# Patient Record
Sex: Female | Born: 1950
Health system: Southern US, Community
[De-identification: ages and names within clinical notes are randomized; demographics above are authoritative.]

---

## 2004-12-11 ENCOUNTER — Emergency Department: Payer: Self-pay | Admitting: Emergency Medicine

## 2004-12-12 ENCOUNTER — Emergency Department: Payer: Self-pay | Admitting: Emergency Medicine

## 2004-12-15 ENCOUNTER — Ambulatory Visit: Payer: Self-pay | Admitting: Physician Assistant

## 2005-01-04 ENCOUNTER — Ambulatory Visit: Payer: Self-pay | Admitting: Unknown Physician Specialty

## 2005-01-09 ENCOUNTER — Encounter: Payer: Self-pay | Admitting: Unknown Physician Specialty

## 2005-01-25 ENCOUNTER — Encounter: Payer: Self-pay | Admitting: Unknown Physician Specialty

## 2005-02-22 ENCOUNTER — Encounter: Payer: Self-pay | Admitting: Unknown Physician Specialty

## 2005-03-25 ENCOUNTER — Encounter: Payer: Self-pay | Admitting: Unknown Physician Specialty

## 2007-06-14 ENCOUNTER — Ambulatory Visit: Payer: Self-pay | Admitting: Specialist

## 2007-08-22 ENCOUNTER — Ambulatory Visit: Payer: Self-pay | Admitting: Gastroenterology

## 2007-08-23 ENCOUNTER — Ambulatory Visit: Payer: Self-pay | Admitting: Internal Medicine

## 2008-03-13 ENCOUNTER — Ambulatory Visit: Payer: Self-pay | Admitting: Specialist

## 2008-12-09 ENCOUNTER — Ambulatory Visit: Payer: Self-pay | Admitting: Unknown Physician Specialty

## 2017-10-10 ENCOUNTER — Ambulatory Visit (INDEPENDENT_AMBULATORY_CARE_PROVIDER_SITE_OTHER): Payer: PPO | Admitting: Primary Care

## 2017-10-10 ENCOUNTER — Encounter: Payer: Self-pay | Admitting: Primary Care

## 2017-10-10 ENCOUNTER — Other Ambulatory Visit (HOSPITAL_COMMUNITY)
Admission: RE | Admit: 2017-10-10 | Discharge: 2017-10-10 | Disposition: A | Discharge status: Home / Self Care | Payer: PPO | Source: Ambulatory Visit | Attending: Primary Care | Admitting: Primary Care

## 2017-10-10 VITALS — BP 118/76 | HR 74 | Temp 98.1°F | Ht 62.75 in | Wt 106.8 lb

## 2017-10-10 DIAGNOSIS — Z0000 Encounter for general adult medical examination without abnormal findings: Secondary | ICD-10-CM | POA: Insufficient documentation

## 2017-10-10 DIAGNOSIS — Z23 Encounter for immunization: Secondary | ICD-10-CM

## 2017-10-10 DIAGNOSIS — Z01419 Encounter for gynecological examination (general) (routine) without abnormal findings: Secondary | ICD-10-CM | POA: Diagnosis not present

## 2017-10-10 DIAGNOSIS — Z124 Encounter for screening for malignant neoplasm of cervix: Secondary | ICD-10-CM | POA: Insufficient documentation

## 2017-10-10 DIAGNOSIS — Z0001 Encounter for general adult medical examination with abnormal findings: Secondary | ICD-10-CM | POA: Insufficient documentation

## 2017-10-10 NOTE — Progress Notes (Signed)
Subjective:    Patient ID: [name], female    DOB: 09-Jun-1951, 66 y.o.   MRN: [id number]  HPI  [name] is a 66 year old female who presents today to establish care and for complete physical. She has not seen a medical provider in years.   Immunizations: -Tetanus: Unsure, believes it's been over 10 years. -Influenza: Due today. -Pneumonia: Never completed.    Diet: She endorses a fair diet. Breakfast: Muffin, cinnamon toast Lunch: Sandwich, hot pockets Dinner: Meat, vegetable, starch Snacks: None Desserts: None Beverages: Coffee, sweet tea, little water  Exercise: She does not currently exercise. Does walk occasionally in the morning. Eye exam: Completed 2-3 years ago. Dental exam: Completed 1 year ago. Colonoscopy: Completed several, thinks this was 8-9 years ago. Dexa: Completed over three years ago. Declines Pap Smear: Completed over 5 years ago. Due today. Mammogram: Has not completed in several years. Declines.    Review of Systems  Constitutional: Negative for unexpected weight change.  HENT: Negative for rhinorrhea.   Respiratory: Negative for cough and shortness of breath.   Cardiovascular: Negative for chest pain.  Gastrointestinal: Negative for constipation and diarrhea.  Genitourinary: Negative for difficulty urinating and menstrual problem.  Musculoskeletal: Negative for arthralgias and myalgias.  Skin: Negative for rash.  Allergic/Immunologic: Negative for environmental allergies.  Neurological: Negative for dizziness, numbness and headaches.  Psychiatric/Behavioral:       Denies concerns for anxiety and depression.       No past medical history on file.   Social History   Social History  . Marital status: Married    Spouse name: N/A  . Number of children: N/A  . Years of education: N/A   Occupational History  . Not on file.   Social History Main Topics  . Smoking status: Former Research scientist (life sciences)  . Smokeless tobacco: Never Used  . Alcohol  use No  . Drug use: Unknown  . Sexual activity: Not on file   Other Topics Concern  . Not on file   Social History Narrative   Married.   2 children, 6 grand children.   Retired. 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     No past surgical history on file.  No family history on file.  No Known Allergies  No current outpatient prescriptions on file prior to visit.   No current facility-administered medications on file prior to visit.     BP 118/76   Pulse 74   Temp 98.1 F (36.7 C) (Oral)   Ht 5' 2.75" (1.594 m)   Wt 106 lb 12.8 oz (48.4 kg)   SpO2 98%   BMI 19.07 kg/m    Objective:   Physical Exam  Constitutional: She is oriented to person, place, and time. She appears well-nourished.  HENT:  Right Ear: Tympanic membrane and ear canal normal.  Left Ear: Tympanic membrane and ear canal normal.  Nose: Nose normal.  Mouth/Throat: Oropharynx is clear and moist.  Eyes: Pupils are equal, round, and reactive to light. Conjunctivae and EOM are normal.  Neck: Neck supple. No thyromegaly present.  Cardiovascular: Normal rate and regular rhythm.   No murmur heard. Pulmonary/Chest: Effort normal and breath sounds normal. She has no rales.  Abdominal: Soft. Bowel sounds are normal. There is no tenderness.  Genitourinary: There is no tenderness or lesion on the right labia. There is no tenderness or lesion on the left labia. Cervix exhibits no motion tenderness and no discharge. Right  adnexum displays no tenderness. Left adnexum displays no tenderness. No erythema in the vagina. No vaginal discharge found.  Musculoskeletal: Normal range of motion.  Lymphadenopathy:    She has no cervical adenopathy.  Neurological: She is alert and oriented to person, place, and time. She has normal reflexes. No cranial nerve deficit.  Skin: Skin is warm and dry. No rash noted.  Psychiatric: She has a normal mood and affect.          Assessment & Plan:

## 2017-10-10 NOTE — Addendum Note (Signed)
Addended by: Jacqualin Combes on: 10/10/2017 04:38 PM   Modules accepted: Orders

## 2017-10-10 NOTE — Patient Instructions (Addendum)
Please notify me if you change your mind regarding the mammogram and bone density testing.   Complete lab work prior to leaving today. I will notify you of your results once received.   We will notify you of your pap smear results once received.   Ensure you are consuming 64 ounces of water daily.  Increase consumption of vegetables, fruit, whole grains.  You were provided with an influenza vaccination and pneumonia vaccination today.  Follow up in 1 year for your annual exam or sooner if needed.  It was a pleasure to meet you today! Please don't hesitate to call me with any questions. Welcome to [facility]!

## 2017-10-10 NOTE — Assessment & Plan Note (Signed)
Will check on insurance coverage regarding tetanus. Prevnar due today, provided. Influenza vaccination provided today. Declines mammogram and bone density testing. Pap smear due, completed today. Colonoscopy UTD per patient. Recommended to eat regular meals, start drinking water. Exam unremarkable. Labs pending. Follow up in 1 year.

## 2017-10-11 LAB — COMPREHENSIVE METABOLIC PANEL
ALBUMIN: 4.4 g/dL (ref 3.5–5.2)
ALT: 11 U/L (ref 0–35)
AST: 19 U/L (ref 0–37)
Alkaline Phosphatase: 77 U/L (ref 39–117)
BUN: 7 mg/dL (ref 6–23)
CHLORIDE: 103 meq/L (ref 96–112)
CO2: 31 mEq/L (ref 19–32)
Calcium: 10.1 mg/dL (ref 8.4–10.5)
Creatinine, Ser: 0.62 mg/dL (ref 0.40–1.20)
GFR: 102.48 mL/min (ref >60.00)
Glucose, Bld: 89 mg/dL (ref 70–99)
POTASSIUM: 3.9 meq/L (ref 3.5–5.1)
SODIUM: 139 meq/L (ref 135–145)
Total Bilirubin: 0.4 mg/dL (ref 0.2–1.2)
Total Protein: 7.4 g/dL (ref 6.0–8.3)

## 2017-10-11 LAB — LIPID PANEL
CHOLESTEROL: 183 mg/dL (ref 0–200)
HDL: 49.1 mg/dL (ref >39.00)
LDL CALC: 106 mg/dL — AB (ref 0–99)
NonHDL: 134.17
Total CHOL/HDL Ratio: 4
Triglycerides: 139 mg/dL (ref 0.0–149.0)
VLDL: 27.8 mg/dL (ref 0.0–40.0)

## 2017-10-15 LAB — CYTOLOGY - PAP
DIAGNOSIS: NEGATIVE
HPV (WINDOPATH): NOT DETECTED

## 2017-10-16 ENCOUNTER — Encounter: Payer: Self-pay | Admitting: *Deleted

## 2018-10-08 ENCOUNTER — Other Ambulatory Visit: Payer: Self-pay | Admitting: Primary Care

## 2018-10-08 DIAGNOSIS — Z0000 Encounter for general adult medical examination without abnormal findings: Secondary | ICD-10-CM

## 2018-10-11 ENCOUNTER — Ambulatory Visit: Payer: PPO

## 2018-11-27 ENCOUNTER — Telehealth: Payer: Self-pay

## 2018-11-27 NOTE — Telephone Encounter (Signed)
[facility] faxed note pt returning call. Do not see note in pts chart.Please advise.

## 2018-11-27 NOTE — Telephone Encounter (Signed)
I have spoken to patient and she stated that she had reschedule the appointment to 12/02/2018. She just wanted to make sure that is still correct.

## 2018-11-29 ENCOUNTER — Ambulatory Visit: Payer: PPO

## 2018-12-02 ENCOUNTER — Ambulatory Visit (INDEPENDENT_AMBULATORY_CARE_PROVIDER_SITE_OTHER): Payer: Medicare HMO

## 2018-12-02 VITALS — BP 100/60 | HR 65 | Temp 98.5°F | Ht 63.5 in | Wt 115.8 lb

## 2018-12-02 DIAGNOSIS — Z23 Encounter for immunization: Secondary | ICD-10-CM

## 2018-12-02 DIAGNOSIS — Z0000 Encounter for general adult medical examination without abnormal findings: Secondary | ICD-10-CM | POA: Diagnosis not present

## 2018-12-02 DIAGNOSIS — Z1159 Encounter for screening for other viral diseases: Secondary | ICD-10-CM

## 2018-12-02 LAB — COMPREHENSIVE METABOLIC PANEL
ALT: 10 U/L (ref 0–35)
AST: 20 U/L (ref 0–37)
Albumin: 4.5 g/dL (ref 3.5–5.2)
Alkaline Phosphatase: 83 U/L (ref 39–117)
BILIRUBIN TOTAL: 0.7 mg/dL (ref 0.2–1.2)
BUN: 8 mg/dL (ref 6–23)
CALCIUM: 9.7 mg/dL (ref 8.4–10.5)
CO2: 28 meq/L (ref 19–32)
Chloride: 102 mEq/L (ref 96–112)
Creatinine, Ser: 0.67 mg/dL (ref 0.40–1.20)
GFR: 93.38 mL/min (ref >60.00)
GLUCOSE: 91 mg/dL (ref 70–99)
POTASSIUM: 4 meq/L (ref 3.5–5.1)
Sodium: 138 mEq/L (ref 135–145)
Total Protein: 7.3 g/dL (ref 6.0–8.3)

## 2018-12-02 LAB — LIPID PANEL
Cholesterol: 186 mg/dL (ref 0–200)
HDL: 57.6 mg/dL (ref >39.00)
LDL CALC: 116 mg/dL — AB (ref 0–99)
NonHDL: 128.85
TRIGLYCERIDES: 65 mg/dL (ref 0.0–149.0)
Total CHOL/HDL Ratio: 3
VLDL: 13 mg/dL (ref 0.0–40.0)

## 2018-12-02 NOTE — Progress Notes (Signed)
I reviewed health advisor's note, was available for consultation, and agree with documentation and plan.  

## 2018-12-02 NOTE — Progress Notes (Signed)
Subjective:   BLANKA ROCKHOLT is a 67 y.o. female who presents for an Initial Medicare Annual Wellness Visit.  Review of Systems    N/A  Cardiac Risk Factors include: advanced age (>28men, >7 women)     Objective:    Today's Vitals   12/02/18 1217  BP: 100/60  Pulse: 65  Temp: 98.5 F (36.9 C)  TempSrc: Oral  SpO2: 95%  Weight: 115 lb 12 oz (52.5 kg)  Height: 5' 3.5" (1.613 m)  PainSc: 0-No pain   Body mass index is 20.18 kg/m.  Advanced Directives 12/02/2018  Does Patient Have a Medical Advance Directive? No  Would patient like information on creating a medical advance directive? Yes (MAU/Ambulatory/Procedural Areas - Information given)    Current Medications (verified) No outpatient encounter medications on file as of 12/02/2018.   No facility-administered encounter medications on file as of 12/02/2018.     Allergies (verified) Patient has no known allergies.   History: History reviewed. No pertinent past medical history. History reviewed. No pertinent surgical history. History reviewed. No pertinent family history. Social History   Socioeconomic History  . Marital status: Married    Spouse name: Not on file  . Number of children: Not on file  . Years of education: Not on file  . Highest education level: Not on file  Occupational History  . Not on file  Social Needs  . Financial resource strain: Not on file  . Food insecurity:    Worry: Not on file    Inability: Not on file  . Transportation needs:    Medical: Not on file    Non-medical: Not on file  Tobacco Use  . Smoking status: Former Research scientist (life sciences)  . Smokeless tobacco: Never Used  Substance and Sexual Activity  . Alcohol use: No  . Drug use: Not Currently  . Sexual activity: Not on file  Lifestyle  . Physical activity:    Days per week: Not on file    Minutes per session: Not on file  . Stress: Not on file  Relationships  . Social connections:    Talks on phone: Not on file    Gets  together: Not on file    Attends religious service: Not on file    Active member of club or organization: Not on file    Attends meetings of clubs or organizations: Not on file    Relationship status: Not on file  Other Topics Concern  . Not on file  Social History Narrative   Married.   2 children, 6 grand children.   Retired. 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     Tobacco Counseling Counseling given: No   Clinical Intake:  Pre-visit preparation completed: Yes  Pain : No/denies pain Pain Score: 0-No pain     Nutritional Status: BMI of 19-24  Normal Nutritional Risks: None Diabetes: No  How often do you need to have someone help you when you read instructions, pamphlets, or other written materials from your doctor or pharmacy?: 1 - Never What is the last grade level you completed in school?: 12th grade + 1 yr college  Interpreter Needed?: No  Comments: pt lives with spouse Information entered by :: LPinson, LPN   Activities of Daily Living In your present state of health, do you have any difficulty performing the following activities: 12/02/2018  Hearing? N  Vision? Y  Difficulty concentrating or making decisions? N  Walking or climbing stairs? N  Dressing or bathing? N  Doing errands, shopping? N  Preparing Food and eating ? N  Using the Toilet? N  In the past six months, have you accidently leaked urine? N  Do you have problems with loss of bowel control? N  Managing your Medications? N  Managing your Finances? N  Housekeeping or managing your Housekeeping? N  Some recent data might be hidden     Immunizations and Health Maintenance Immunization History  Administered Date(s) Administered  . Influenza,inj,Quad PF,6+ Mos 10/10/2017, 12/02/2018  . Pneumococcal Conjugate-13 10/10/2017  . Pneumococcal Polysaccharide-23 12/02/2018   There are no preventive care reminders to display for this patient.  Patient Care Team: Pleas Koch, NP as PCP - General (Internal Medicine)  Indicate any recent Medical Services you may have received from other than [facility] providers in the past year (date may be approximate).     Assessment:   This is a routine wellness examination for [place].  Hearing/Vision screen  Hearing Screening   125Hz  250Hz  500Hz  1000Hz  2000Hz  3000Hz  4000Hz  6000Hz  8000Hz   Right ear:   40 40 40  40    Left ear:   40 40 40  40      Visual Acuity Screening   Right eye Left eye Both eyes  Without correction:     With correction: 20/50 20/40-1 20/50    Dietary issues and exercise activities discussed: Current Exercise Habits: The patient does not participate in regular exercise at present, Exercise limited by: None identified  Goals    . Patient Stated     Starting 12/02/2018,  I will continue to keep all scheduled appointments with my PCP.      Depression Screen PHQ 2/9 Scores 12/02/2018 10/10/2017  PHQ - 2 Score 0 0  PHQ- 9 Score 0 -    Fall Risk Fall Risk  12/02/2018 10/10/2017  Falls in the past year? 0 No   Cognitive Function: MMSE - Mini Mental State Exam 12/02/2018  Orientation to time 5  Orientation to Place 5  Registration 3  Attention/ Calculation 0  Recall 3  Language- name 2 objects 0  Language- repeat 1  Language- follow 3 step command 3  Language- read & follow direction 0  Write a sentence 0  Copy design 0  Total score 20     PLEASE NOTE: A Mini-Cog screen was completed. Maximum score is 20. A value of 0 denotes this part of Folstein MMSE was not completed or the patient failed this part of the Mini-Cog screening.   Mini-Cog Screening Orientation to Time - Max 5 pts Orientation to Place - Max 5 pts Registration - Max 3 pts Recall - Max 3 pts Language Repeat - Max 1 pts Language Follow 3 Step Command - Max 3 pts     Screening Tests Health Maintenance  Topic Date Due  . MAMMOGRAM  12/25/2019 (Originally 03/02/2002)  . DEXA SCAN  12/25/2019 (Originally  03/02/2017)  . COLONOSCOPY  12/25/2019 (Originally 03/02/2002)  . TETANUS/TDAP  12/25/2019 (Originally 03/03/1971)  . INFLUENZA VACCINE  Completed  . Hepatitis C Screening  Completed  . PNA vac Low Risk Adult  Completed     Plan:   I have personally reviewed, addressed, and noted the following in the patient's chart:  A. Medical and social history B. Use of alcohol, tobacco or illicit drugs  C. Current medications and supplements D. Functional ability and status E.  Nutritional status F.  Physical activity G. Advance directives H. List of  other physicians I.  Hospitalizations, surgeries, and ER visits in previous 12 months J.  [place] to include hearing, vision, cognitive, depression L. Referrals and appointments - none  In addition, I have reviewed and discussed with patient certain preventive protocols, quality metrics, and best practice recommendations. A written personalized care plan for preventive services as well as general preventive health recommendations were provided to patient.  See attached scanned questionnaire for additional information.   Signed,   Lindell Noe, MHA, BS, LPN Health Coach

## 2018-12-02 NOTE — Progress Notes (Signed)
PCP notes:   Health maintenance:  PPSV23 - administered Flu vaccine - administered Mammogram - PCP follow-up requested Bone density - PCP follow-up requested Colon cancer screening - PCP follow-up requested Tetanus vaccine - postponed/insurance Hep C screening - completed  Abnormal screenings:   None  Patient concerns:   None  Nurse concerns:  None  Next PCP appt:   12/05/18 @ 1100

## 2018-12-02 NOTE — Patient Instructions (Signed)
Ms. Mikaelian , Thank you for taking time to come for your Medicare Wellness Visit. I appreciate your ongoing commitment to your health goals. Please review the following plan we discussed and let me know if I can assist you in the future.   These are the goals we discussed: Goals    . Patient Stated     Starting 12/02/2018,  I will continue to keep all scheduled appointments with my PCP.       This is a list of the screening recommended for you and due dates:  Health Maintenance  Topic Date Due  . Mammogram  12/25/2019*  . DEXA scan (bone density measurement)  12/25/2019*  . Colon Cancer Screening  12/25/2019*  . Tetanus Vaccine  12/25/2019*  . Flu Shot  Completed  .  Hepatitis C: One time screening is recommended by Center for Disease Control  (CDC) for  adults born from [birth year] through 1964.   Completed  . Pneumonia vaccines  Completed  *Topic was postponed. The date shown is not the original due date.   Preventive Care for Adults  A healthy lifestyle and preventive care can promote health and wellness. Preventive health guidelines for adults include the following key practices.  . A routine yearly physical is a good way to check with your health care provider about your health and preventive screening. It is a chance to share any concerns and updates on your health and to receive a thorough exam.  . Visit your dentist for a routine exam and preventive care every 6 months. Brush your teeth twice a day and floss once a day. Good oral hygiene prevents tooth decay and gum disease.  . The frequency of eye exams is based on your age, health, family medical history, use  of contact lenses, and other factors. Follow your health care provider's recommendations for frequency of eye exams.  . Eat a healthy diet. Foods like vegetables, fruits, whole grains, low-fat dairy products, and lean protein foods contain the nutrients you need without too many calories. Decrease your intake of foods high in  solid fats, added sugars, and salt. Eat the right amount of calories for you. Get information about a proper diet from your health care provider, if necessary.  . Regular physical exercise is one of the most important things you can do for your health. Most adults should get at least 150 minutes of moderate-intensity exercise (any activity that increases your heart rate and causes you to sweat) each week. In addition, most adults need muscle-strengthening exercises on 2 or more days a week.  Silver Sneakers may be a benefit available to you. To determine eligibility, you may visit the website: www.silversneakers.com or contact program at 702-828-8389 Mon-Fri between 8AM-8PM.   . Maintain a healthy weight. The body mass index (BMI) is a screening tool to identify possible weight problems. It provides an estimate of body fat based on height and weight. Your health care provider can find your BMI and can help you achieve or maintain a healthy weight.   For adults 20 years and older: ? A BMI below 18.5 is considered underweight. ? A BMI of 18.5 to 24.9 is normal. ? A BMI of 25 to 29.9 is considered overweight. ? A BMI of 30 and above is considered obese.   . Maintain normal blood lipids and cholesterol levels by exercising and minimizing your intake of saturated fat. Eat a balanced diet with plenty of fruit and vegetables. Blood tests for lipids and cholesterol  should begin at age 64 and be repeated every 5 years. If your lipid or cholesterol levels are high, you are over 50, or you are at high risk for heart disease, you may need your cholesterol levels checked more frequently. Ongoing high lipid and cholesterol levels should be treated with medicines if diet and exercise are not working.  . If you smoke, find out from your health care provider how to quit. If you do not use tobacco, please do not start.  . If you choose to drink alcohol, please do not consume more than 2 drinks per day. One drink  is considered to be 12 ounces (355 mL) of beer, 5 ounces (148 mL) of wine, or 1.5 ounces (44 mL) of liquor.  . If you are 38-47 years old, ask your health care provider if you should take aspirin to prevent strokes.  . Use sunscreen. Apply sunscreen liberally and repeatedly throughout the day. You should seek shade when your shadow is shorter than you. Protect yourself by wearing long sleeves, pants, a wide-brimmed hat, and sunglasses year round, whenever you are outdoors.  . Once a month, do a whole body skin exam, using a mirror to look at the skin on your back. Tell your health care provider of new moles, moles that have irregular borders, moles that are larger than a pencil eraser, or moles that have changed in shape or color.

## 2018-12-03 LAB — HEPATITIS C ANTIBODY
Hepatitis C Ab: NONREACTIVE
SIGNAL TO CUT-OFF: 0.02 (ref <1.00)

## 2018-12-05 ENCOUNTER — Encounter: Payer: PPO | Admitting: Primary Care

## 2018-12-06 ENCOUNTER — Ambulatory Visit (INDEPENDENT_AMBULATORY_CARE_PROVIDER_SITE_OTHER): Payer: Medicare HMO | Admitting: Primary Care

## 2018-12-06 VITALS — BP 116/66 | HR 57 | Temp 98.2°F | Ht 63.0 in | Wt 115.8 lb

## 2018-12-06 DIAGNOSIS — Z23 Encounter for immunization: Secondary | ICD-10-CM

## 2018-12-06 DIAGNOSIS — Z1239 Encounter for other screening for malignant neoplasm of breast: Secondary | ICD-10-CM

## 2018-12-06 DIAGNOSIS — E538 Deficiency of other specified B group vitamins: Secondary | ICD-10-CM | POA: Diagnosis not present

## 2018-12-06 DIAGNOSIS — R5383 Other fatigue: Secondary | ICD-10-CM | POA: Diagnosis not present

## 2018-12-06 DIAGNOSIS — E2839 Other primary ovarian failure: Secondary | ICD-10-CM

## 2018-12-06 DIAGNOSIS — Z0000 Encounter for general adult medical examination without abnormal findings: Secondary | ICD-10-CM

## 2018-12-06 DIAGNOSIS — F331 Major depressive disorder, recurrent, moderate: Secondary | ICD-10-CM | POA: Insufficient documentation

## 2018-12-06 DIAGNOSIS — Z1211 Encounter for screening for malignant neoplasm of colon: Secondary | ICD-10-CM | POA: Diagnosis not present

## 2018-12-06 MED ORDER — ZOSTER VAC RECOMB ADJUVANTED 50 MCG/0.5ML IM SUSR: 0.5000 mL | Freq: Once | INTRAMUSCULAR | 1 refills | Status: AC

## 2018-12-06 MED ORDER — SERTRALINE HCL 25 MG PO TABS: 25.0000 mg | ORAL_TABLET | Freq: Every day | ORAL | 1 refills | Status: DC

## 2018-12-06 NOTE — Assessment & Plan Note (Signed)
Chronic for several years, PHQ 9 score of 12 today. Discussed different options for treatment, she opts for mediation and declines therapy.  Rx for Zoloft 25 mg sent to pharmacy. Patient is to take 1/2 tablet daily for 6 days, then advance to 1 full tablet thereafter. We discussed possible side effects of headache, GI upset, drowsiness, and SI/HI. If thoughts of SI/HI develop, we discussed to present to the emergency immediately. Patient verbalized understanding.   Follow up in 6 weeks for re-evaluation.

## 2018-12-06 NOTE — Assessment & Plan Note (Signed)
Immunizations UTD. Rx for Shingrix provided. Mammogram and bone density tests due, ordered. Colonoscopy overdue, referral placed for GI. Recommended regular exercise, healthy diet. Exam unremarkable, see HPI regarding depression. Labs reviewed. Follow up in 1 year for CPE.

## 2018-12-06 NOTE — Assessment & Plan Note (Signed)
Suspect secondary to depression, however, will check CBC, TSH, and B12 to rule out metabolic cause. Other labs unremarkable.

## 2018-12-06 NOTE — Patient Instructions (Addendum)
Stop by the lab prior to leaving today. I will notify you of your results once received.   You will be contacted regarding your referral to GI for the colonoscopy.  Please let us know if you have not been contacted within one week.   Take the shingles vaccination to your pharmacy for administration.  Call the [facility] to schedule your mammogram.  Start sertraline (Zoloft) 25 mg tablets once daily for depression. Start by taking 1/2 tablet daily for 6 days, then increase to 1 full tablet thereafter.  Start exercising. You should be getting 150 minutes of moderate intensity exercise weekly.  It's important to improve your diet by reducing consumption of fast food, fried food, processed snack foods, sugary drinks. Increase consumption of fresh vegetables and fruits, whole grains, water.  Ensure you are drinking 64 ounces of water daily.  Schedule a follow up visit for 6 weeks.   It was a pleasure to see you today!   Preventive Care 7 Years and Older, Female Preventive care refers to lifestyle choices and visits with your health care provider that can promote health and wellness. What does preventive care include?  A yearly physical exam. This is also called an annual well check.  Dental exams once or twice a year.  Routine eye exams. Ask your health care provider how often you should have your eyes checked.  Personal lifestyle choices, including: ? Daily care of your teeth and gums. ? Regular physical activity. ? Eating a healthy diet. ? Avoiding tobacco and drug use. ? Limiting alcohol use. ? Practicing safe sex. ? Taking low-dose aspirin every day. ? Taking vitamin and mineral supplements as recommended by your health care provider. What happens during an annual well check? The services and screenings done by your health care provider during your annual well check will depend on your age, overall health, lifestyle risk factors, and family history of  disease. Counseling Your health care provider may ask you questions about your:  Alcohol use.  Tobacco use.  Drug use.  Emotional well-being.  Home and relationship well-being.  Sexual activity.  Eating habits.  History of falls.  Memory and ability to understand (cognition).  Work and work Statistician.  Reproductive health.  Screening You may have the following tests or measurements:  Height, weight, and BMI.  Blood pressure.  Lipid and cholesterol levels. These may be checked every 5 years, or more frequently if you are over 32 years old.  Skin check.  Lung cancer screening. You may have this screening every year starting at age 36 if you have a 30-pack-year history of smoking and currently smoke or have quit within the past 15 years.  Fecal occult blood test (FOBT) of the stool. You may have this test every year starting at age 51.  Flexible sigmoidoscopy or colonoscopy. You may have a sigmoidoscopy every 5 years or a colonoscopy every 10 years starting at age 3.  Hepatitis C blood test.  Hepatitis B blood test.  Sexually transmitted disease (STD) testing.  Diabetes screening. This is done by checking your blood sugar (glucose) after you have not eaten for a while (fasting). You may have this done every 1-3 years.  Bone density scan. This is done to screen for osteoporosis. You may have this done starting at age 71.  Mammogram. This may be done every 1-2 years. Talk to your health care provider about how often you should have regular mammograms.  Talk with your health care provider about your  test results, treatment options, and if necessary, the need for more tests. Vaccines Your health care provider may recommend certain vaccines, such as:  Influenza vaccine. This is recommended every year.  Tetanus, diphtheria, and acellular pertussis (Tdap, Td) vaccine. You may need a Td booster every 10 years.  Varicella vaccine. You may need this if you have  not been vaccinated.  Zoster vaccine. You may need this after age 22.  Measles, mumps, and rubella (MMR) vaccine. You may need at least one dose of MMR if you were born in 1957 or later. You may also need a second dose.  Pneumococcal 13-valent conjugate (PCV13) vaccine. One dose is recommended after age 26.  Pneumococcal polysaccharide (PPSV23) vaccine. One dose is recommended after age 20.  Meningococcal vaccine. You may need this if you have certain conditions.  Hepatitis A vaccine. You may need this if you have certain conditions or if you travel or work in places where you may be exposed to hepatitis A.  Hepatitis B vaccine. You may need this if you have certain conditions or if you travel or work in places where you may be exposed to hepatitis B.  Haemophilus influenzae type b (Hib) vaccine. You may need this if you have certain conditions.  Talk to your health care provider about which screenings and vaccines you need and how often you need them. This information is not intended to replace advice given to you by your health care provider. Make sure you discuss any questions you have with your health care provider. Document Released: 01/07/2016 Document Revised: 08/30/2016 Document Reviewed: 10/12/2015 Elsevier Interactive Patient Education  [employer].

## 2018-12-06 NOTE — Progress Notes (Signed)
Subjective:    Patient ID: [name], female    DOB: 22-May-1951, 67 y.o.   MRN: [id number]  HPI  Ms. Suhre is a 67 year old female who presents today for complete physical. She'd also like to discuss depression.  Symptoms of depression for several years, started when her mother became ill and was caring for her. Her husband has numerous medical problems and  is showing signs of dementia which has been frustrating. Also with little motivation to do anything, doesn't want to go anywhere. Also with symptoms of irritability, feels down/depression. PHQ 9 score of 12. Denies SI/HI.   Immunizations: -Influenza: Completed this season  -Pneumonia: Completed in 2019 -Shingles: Declines   Diet:  She endorses a healthy diet Breakfast: Sausage, eggs, sometimes skip Lunch: Sandwich Dinner: Meat, vegetable, starch Snacks: None Desserts: None Beverages: Coffee, sweet tea, no water  Exercise: She is active at work, no regular exercise Eye exam: No recent exam  Dental exam: Completed 1-2 years ago Colonoscopy: Completed over 10 years ago.  Dexa: No recent exam Pap Smear: Completed in 2018 Mammogram: No recent exam Hep C Screen: Negative in 2019   Review of Systems  Constitutional: Negative for unexpected weight change.  HENT: Negative for rhinorrhea.   Respiratory: Negative for cough and shortness of breath.   Cardiovascular: Negative for chest pain.  Gastrointestinal:       Intermittent constipation/diarrhea  Genitourinary: Negative for difficulty urinating.  Musculoskeletal: Negative for arthralgias and myalgias.  Skin: Negative for rash.  Allergic/Immunologic: Negative for environmental allergies.  Neurological: Negative for dizziness, numbness and headaches.  Psychiatric/Behavioral: The patient is not nervous/anxious.        See HPI       No past medical history on file.   Social History   Socioeconomic History  . Marital status: Married    Spouse name: Not on file    . Number of children: Not on file  . Years of education: Not on file  . Highest education level: Not on file  Occupational History  . Not on file  Social Needs  . Financial resource strain: Not on file  . Food insecurity:    Worry: Not on file    Inability: Not on file  . Transportation needs:    Medical: Not on file    Non-medical: Not on file  Tobacco Use  . Smoking status: Former Research scientist (life sciences)  . Smokeless tobacco: Never Used  Substance and Sexual Activity  . Alcohol use: No  . Drug use: Not Currently  . Sexual activity: Not on file  Lifestyle  . Physical activity:    Days per week: Not on file    Minutes per session: Not on file  . Stress: Not on file  Relationships  . Social connections:    Talks on phone: Not on file    Gets together: Not on file    Attends religious service: Not on file    Active member of club or organization: Not on file    Attends meetings of clubs or organizations: Not on file    Relationship status: Not on file  . Intimate partner violence:    Fear of current or ex partner: Not on file    Emotionally abused: Not on file    Physically abused: Not on file    Forced sexual activity: Not on file  Other Topics Concern  . Not on file  Social History Narrative   Married.   2 children, 6 grand  children.   Retired. 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     No past surgical history on file.  No family history on file.  No Known Allergies  No current outpatient medications on file prior to visit.   No current facility-administered medications on file prior to visit.     BP 116/66   Pulse (!) 57   Temp 98.2 F (36.8 C) (Oral)   Ht 5\' 3"  (1.6 m)   Wt 115 lb 12 oz (52.5 kg)   SpO2 97%   BMI 20.50 kg/m    Objective:   Physical Exam  Constitutional: She is oriented to person, place, and time. She appears well-nourished.  HENT:  Mouth/Throat: No oropharyngeal exudate.  Eyes: Pupils are equal, round, and reactive to  light. EOM are normal.  Neck: Neck supple. No thyromegaly present.  Cardiovascular: Normal rate and regular rhythm.  Respiratory: Effort normal and breath sounds normal.  GI: Soft. Bowel sounds are normal. There is no abdominal tenderness.  Musculoskeletal: Normal range of motion.  Neurological: She is alert and oriented to person, place, and time.  Skin: Skin is warm and dry.  Psychiatric: She has a normal mood and affect.           Assessment & Plan:

## 2018-12-07 LAB — CBC
HCT: 38.4 % (ref 35.0–45.0)
Hemoglobin: 13.3 g/dL (ref 11.7–15.5)
MCH: 30.8 pg (ref 27.0–33.0)
MCHC: 34.6 g/dL (ref 32.0–36.0)
MCV: 88.9 fL (ref 80.0–100.0)
MPV: 9.3 fL (ref 7.5–12.5)
Platelets: 358 10*3/uL (ref 140–400)
RBC: 4.32 10*6/uL (ref 3.80–5.10)
RDW: 12.1 % (ref 11.0–15.0)
WBC: 5.7 10*3/uL (ref 3.8–10.8)

## 2018-12-07 LAB — TSH: TSH: 2.44 mIU/L (ref 0.40–4.50)

## 2018-12-07 LAB — VITAMIN B12: VITAMIN B 12: 208 pg/mL (ref 200–1100)

## 2018-12-09 ENCOUNTER — Telehealth: Payer: Self-pay | Admitting: Primary Care

## 2018-12-09 DIAGNOSIS — F331 Major depressive disorder, recurrent, moderate: Secondary | ICD-10-CM

## 2018-12-09 MED ORDER — SERTRALINE HCL 25 MG PO TABS: 25.0000 mg | ORAL_TABLET | Freq: Every day | ORAL | 1 refills | Status: DC

## 2018-12-09 NOTE — Telephone Encounter (Signed)
Tried calling pt  Regarding bone density/mammogram  No voicemail set up

## 2018-12-09 NOTE — Telephone Encounter (Signed)
Called [facility] and was told they didn't received the Rx for Zoloft send on 12/06/2018  I have re-sent the Rx and was told they me that they received it.  I have also called patient to let us know.

## 2018-12-09 NOTE — Telephone Encounter (Signed)
Pt need a refill for zoloft    [facility]

## 2018-12-19 ENCOUNTER — Other Ambulatory Visit: Payer: Self-pay

## 2018-12-19 DIAGNOSIS — Z1211 Encounter for screening for malignant neoplasm of colon: Secondary | ICD-10-CM

## 2018-12-26 ENCOUNTER — Encounter: Payer: Self-pay | Admitting: Primary Care

## 2018-12-27 ENCOUNTER — Encounter: Payer: Self-pay | Admitting: Primary Care

## 2019-01-09 ENCOUNTER — Ambulatory Visit (INDEPENDENT_AMBULATORY_CARE_PROVIDER_SITE_OTHER): Payer: Medicare HMO | Admitting: Primary Care

## 2019-01-09 ENCOUNTER — Encounter: Payer: Self-pay | Admitting: Primary Care

## 2019-01-09 VITALS — BP 122/74 | HR 64 | Temp 98.6°F | Ht 63.0 in | Wt 114.0 lb

## 2019-01-09 DIAGNOSIS — F331 Major depressive disorder, recurrent, moderate: Secondary | ICD-10-CM | POA: Diagnosis not present

## 2019-01-09 DIAGNOSIS — R197 Diarrhea, unspecified: Secondary | ICD-10-CM | POA: Diagnosis not present

## 2019-01-09 LAB — CBC WITH DIFFERENTIAL/PLATELET
Basophils Absolute: 0 10*3/uL (ref 0.0–0.1)
Basophils Relative: 0.8 % (ref 0.0–3.0)
Eosinophils Absolute: 0.2 10*3/uL (ref 0.0–0.7)
Eosinophils Relative: 3 % (ref 0.0–5.0)
HCT: 39.8 % (ref 36.0–46.0)
HEMOGLOBIN: 13.5 g/dL (ref 12.0–15.0)
Lymphocytes Relative: 26.3 % (ref 12.0–46.0)
Lymphs Abs: 1.3 10*3/uL (ref 0.7–4.0)
MCHC: 33.8 g/dL (ref 30.0–36.0)
MCV: 89.4 fl (ref 78.0–100.0)
Monocytes Absolute: 0.6 10*3/uL (ref 0.1–1.0)
Monocytes Relative: 10.9 % (ref 3.0–12.0)
Neutro Abs: 3 10*3/uL (ref 1.4–7.7)
Neutrophils Relative %: 59 % (ref 43.0–77.0)
Platelets: 360 10*3/uL (ref 150.0–400.0)
RBC: 4.45 Mil/uL (ref 3.87–5.11)
RDW: 13.8 % (ref 11.5–15.5)
WBC: 5 10*3/uL (ref 4.0–10.5)

## 2019-01-09 LAB — BASIC METABOLIC PANEL
BUN: 5 mg/dL — AB (ref 6–23)
CO2: 30 mEq/L (ref 19–32)
CREATININE: 0.65 mg/dL (ref 0.40–1.20)
Calcium: 9.2 mg/dL (ref 8.4–10.5)
Chloride: 103 mEq/L (ref 96–112)
GFR: 96.67 mL/min (ref >60.00)
Glucose, Bld: 93 mg/dL (ref 70–99)
POTASSIUM: 3.4 meq/L — AB (ref 3.5–5.1)
Sodium: 139 mEq/L (ref 135–145)

## 2019-01-09 MED ORDER — FLUOXETINE HCL 10 MG PO TABS: 10.0000 mg | ORAL_TABLET | Freq: Every day | ORAL | 1 refills | Status: DC

## 2019-01-09 NOTE — Progress Notes (Signed)
Subjective:    Patient ID: Ann Cordova, female    DOB: [birth year], 68 y.o.   MRN: [id number]  HPI  Ann Cordova is a 68 year old female who presents today for follow up or depression.   She was last evaluated on 12/06/18 for her physical, endorsed several year history of depression which began when her mother initially became ill. Symptoms included little motivation to do anything, didn't want to interact with anyone, irritability. PHQ 9 score was 12 so she was initiated on Zoloft 25 mg.  Since her last visit she's noticed improvement in her symptoms. Positive effects include more motivation to get out of bed, has noticed much less depression, is able to handle stressful situations much better.   She has had diarrhea since starting the Zoloft on 12/09/18. Her diarrhea has been daily (multiple times daily) since initiation. She denies headaches, dizziness, suicidal thoughts. PHQ 9 score of 10 today.   Review of Systems  Respiratory: Negative for shortness of breath.   Cardiovascular: Negative for chest pain.  Gastrointestinal: Positive for diarrhea. Negative for abdominal pain, blood in stool and nausea.  Neurological: Negative for dizziness and headaches.       No past medical history on file.   Social History   Socioeconomic History  . Marital status: Married    Spouse name: Not on file  . Number of children: Not on file  . Years of education: Not on file  . Highest education level: Not on file  Occupational History  . Not on file  Social Needs  . Financial resource strain: Not on file  . Food insecurity:    Worry: Not on file    Inability: Not on file  . Transportation needs:    Medical: Not on file    Non-medical: Not on file  Tobacco Use  . Smoking status: Former Research scientist (life sciences)  . Smokeless tobacco: Never Used  Substance and Sexual Activity  . Alcohol use: No  . Drug use: Not Currently  . Sexual activity: Not on file  Lifestyle  . Physical activity:    Days per week:  Not on file    Minutes per session: Not on file  . Stress: Not on file  Relationships  . Social connections:    Talks on phone: Not on file    Gets together: Not on file    Attends religious service: Not on file    Active member of club or organization: Not on file    Attends meetings of clubs or organizations: Not on file    Relationship status: Not on file  . Intimate partner violence:    Fear of current or ex partner: Not on file    Emotionally abused: Not on file    Physically abused: Not on file    Forced sexual activity: Not on file  Other Topics Concern  . Not on file  Social History Narrative   Married.   2 children, 6 grand children.   Retired. 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     No past surgical history on file.  No family history on file.  No Known Allergies  Current Outpatient Medications on File Prior to Visit  Medication Sig Dispense Refill  . sertraline (ZOLOFT) 25 MG tablet Take 1 tablet (25 mg total) by mouth daily. For depression. 30 tablet 1   No current facility-administered medications on file prior to visit.     BP 122/74 (BP Location:  Right Arm, Patient Position: Sitting, Cuff Size: Normal)   Pulse 64   Temp 98.6 F (37 C) (Oral)   Ht 5\' 3"  (1.6 m)   Wt 114 lb (51.7 kg)   SpO2 96%   BMI 20.19 kg/m    Objective:   Physical Exam  Constitutional: She appears well-nourished.  Neck: Neck supple.  Cardiovascular: Normal rate and regular rhythm.  Respiratory: Effort normal and breath sounds normal.  GI: Soft. Bowel sounds are normal. There is no abdominal tenderness.  Skin: Skin is warm and dry.  Psychiatric: She has a normal mood and affect.           Assessment & Plan:

## 2019-01-09 NOTE — Patient Instructions (Signed)
Stop sertraline (Zoloft) for depression. This is likely the cause of your diarrhea.  Start fluoxetine (Prozac) 10 mg for depression. Take 1 tablet once daily.  Stop by the lab prior to leaving today. I will notify you of your results once received.   Please call me if you continue to notice diarrhea after two weeks of the newer medication.  Schedule a follow up visit with me for 6 weeks.  It was a pleasure to see you today!

## 2019-01-09 NOTE — Assessment & Plan Note (Signed)
Improved on Zoloft but has had diarrhea since initiation.  Will switch to fluoxetine 10 mg. Discussed directions with patient. Also check CBC and BMP to ensure no other cause for diarrhea. Follow up in 6 weeks.

## 2019-01-17 ENCOUNTER — Ambulatory Visit: Payer: Medicare HMO | Admitting: Primary Care

## 2019-01-23 ENCOUNTER — Telehealth: Payer: Self-pay | Admitting: Gastroenterology

## 2019-01-23 NOTE — Telephone Encounter (Signed)
Patient called and needs to reschedule her colonoscopy on 02-10-2019 With Dr Vicente Males. I think she needs Wednesday's.

## 2019-01-24 NOTE — Telephone Encounter (Signed)
Returned patients call regarding date change for her colonoscopy.  Her date has been changed to [facility] 02/12/19.  She has also been informed to contact Olivia Mackie regarding insurance.  Thanks [employer]

## 2019-01-31 ENCOUNTER — Other Ambulatory Visit: Payer: Self-pay | Admitting: Primary Care

## 2019-01-31 DIAGNOSIS — F331 Major depressive disorder, recurrent, moderate: Secondary | ICD-10-CM

## 2019-02-04 ENCOUNTER — Ambulatory Visit
Admission: RE | Admit: 2019-02-04 | Discharge: 2019-02-04 | Disposition: A | Discharge status: Home / Self Care | Payer: Medicare HMO | Source: Ambulatory Visit | Attending: Primary Care | Admitting: Primary Care

## 2019-02-04 DIAGNOSIS — E2839 Other primary ovarian failure: Secondary | ICD-10-CM

## 2019-02-04 DIAGNOSIS — Z1239 Encounter for other screening for malignant neoplasm of breast: Secondary | ICD-10-CM | POA: Diagnosis present

## 2019-02-04 DIAGNOSIS — Z1231 Encounter for screening mammogram for malignant neoplasm of breast: Secondary | ICD-10-CM | POA: Insufficient documentation

## 2019-02-05 ENCOUNTER — Telehealth: Payer: Self-pay | Admitting: Primary Care

## 2019-02-05 NOTE — Telephone Encounter (Signed)
I told her during her last visit to follow back up with me 6 weeks after the medication was initiated. I would like for her to do as I recommended as it can take 6 weeks for the medication to take full effect. I do not see that she has an appointment scheduled, please schedule Thanks.

## 2019-02-05 NOTE — Telephone Encounter (Signed)
Pt is taking Prozac and said it hasn't help with her feeling better like the Zoloft . Pt is having headaches but don't know if it's coming from the Prozac. Pt stated the Zoloft gave her diarrhea but this medication doesn't. Please advise pt what she need to do. Should she keep trying the Prozac or try something different. Please call pt.

## 2019-02-06 NOTE — Telephone Encounter (Signed)
Spoken and notified patient of Ann Cordova comments. Patient verbalized understanding.  Patient stated that she will take it and will schedule later time. I told her by end of this month.

## 2019-02-07 ENCOUNTER — Other Ambulatory Visit: Payer: Self-pay | Admitting: Primary Care

## 2019-02-07 DIAGNOSIS — M810 Age-related osteoporosis without current pathological fracture: Secondary | ICD-10-CM

## 2019-02-07 MED ORDER — ALENDRONATE SODIUM 70 MG PO TABS: ORAL_TABLET | ORAL | 3 refills | Status: DC

## 2019-02-11 ENCOUNTER — Telehealth: Payer: Self-pay | Admitting: Gastroenterology

## 2019-02-11 NOTE — Telephone Encounter (Signed)
Patient called stating she is beginning not to feel well & is scheduled for her colonoscopy 02-12-2019.She just doesn't want to be charged.

## 2019-02-11 NOTE — Telephone Encounter (Signed)
Call returned.  Colonoscopy has been rescheduled to March 18th with Dr. Vicente Males.  Trish in Endoscopy notified.  Referral updated.  Thanks [employer]

## 2019-02-11 NOTE — Telephone Encounter (Signed)
Pt left vm she has a procedure tomorrow 02/12/19 and is sick she states her head is hurting and she feels achy and her nose is stuffed up please call pt for advise

## 2019-02-19 ENCOUNTER — Ambulatory Visit (INDEPENDENT_AMBULATORY_CARE_PROVIDER_SITE_OTHER): Payer: Medicare HMO | Admitting: Primary Care

## 2019-02-19 ENCOUNTER — Encounter: Payer: Self-pay | Admitting: Primary Care

## 2019-02-19 VITALS — BP 124/74 | HR 63 | Temp 98.0°F | Ht 63.0 in | Wt 111.2 lb

## 2019-02-19 DIAGNOSIS — F331 Major depressive disorder, recurrent, moderate: Secondary | ICD-10-CM

## 2019-02-19 DIAGNOSIS — J309 Allergic rhinitis, unspecified: Secondary | ICD-10-CM | POA: Insufficient documentation

## 2019-02-19 MED ORDER — FLUOXETINE HCL 20 MG PO TABS: 20.0000 mg | ORAL_TABLET | Freq: Every day | ORAL | 0 refills | Status: DC

## 2019-02-19 NOTE — Assessment & Plan Note (Signed)
Did better on Zoloft but does seem to have improvement on fluoxetine. Agree that a dose increase is necessary, will increase to 20 mg daily.  She will update in 4 weeks. Denies SI/HI.

## 2019-02-19 NOTE — Patient Instructions (Signed)
We've increased the does of your fluoxetine to 20 mg. I sent a new prescription to your pharmacy.  Continue the antihistamine daily for allergies. Nasal Congestion/Ear Pressure/Sinus Pressure: Try using Flonase (fluticasone) nasal spray. Instill 1 spray in each nostril twice daily.   Please call me with an update in 4 weeks.   It was a pleasure to see you today!

## 2019-02-19 NOTE — Progress Notes (Signed)
Subjective:    Patient ID: Ann Cordova, female    DOB: 1951-12-21, 68 y.o.   MRN: [id number]  HPI  Ann Cordova is a 68 year old female with a history of fatigue and depression who presents today for follow up of depression.   She was last evaluated on 01/09/19 for follow up of depression. She had been on Zoloft 25 mg at that time and had seen improvement in motivation and less depression but unable to tolerate side effects of diarrhea. Given the intolerable side effects she was switched to fluoxetine 10 mg.   Since her last visit her diarrhea has resolved. She has noticed some improvement on fluoxetine as she is not as tearful, less irritability, is more able to get out of bed in the morning. She feels as though she could use a dose increase, doesn't feel like she's getting enough. She did initially experience headaches but this has resolved. She did feel better on Zoloft. Denies SI/HI.   She would also like to discuss nasal congestion, rhinorrhea, post nasal drip, ear fullness decreased sensation of taste. Symptoms began about two weeks ago. She denies fevers. She's taken Benadryl Sinus Headache, and a generic antihistamine with improvement.   Review of Systems  Gastrointestinal: Negative for diarrhea.  Neurological: Negative for dizziness and headaches.  Psychiatric/Behavioral: Negative for suicidal ideas.       See HPI       No past medical history on file.   Social History   Socioeconomic History  . Marital status: Married    Spouse name: Not on file  . Number of children: Not on file  . Years of education: Not on file  . Highest education level: Not on file  Occupational History  . Not on file  Social Needs  . Financial resource strain: Not on file  . Food insecurity:    Worry: Not on file    Inability: Not on file  . Transportation needs:    Medical: Not on file    Non-medical: Not on file  Tobacco Use  . Smoking status: Former Research scientist (life sciences)  . Smokeless tobacco: Never  Used  Substance and Sexual Activity  . Alcohol use: No  . Drug use: Not Currently  . Sexual activity: Not on file  Lifestyle  . Physical activity:    Days per week: Not on file    Minutes per session: Not on file  . Stress: Not on file  Relationships  . Social connections:    Talks on phone: Not on file    Gets together: Not on file    Attends religious service: Not on file    Active member of club or organization: Not on file    Attends meetings of clubs or organizations: Not on file    Relationship status: Not on file  . Intimate partner violence:    Fear of current or ex partner: Not on file    Emotionally abused: Not on file    Physically abused: Not on file    Forced sexual activity: Not on file  Other Topics Concern  . Not on file  Social History Narrative   Married.   2 children, 6 grand children.   Retired. 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     No past surgical history on file.  No family history on file.  No Known Allergies  Current Outpatient Medications on File Prior to Visit  Medication Sig Dispense Refill  .  alendronate (FOSAMAX) 70 MG tablet Take 1 tablet by mouth once weekly on empty stomach with water only.  Do not eat for 30 minutes, do not lay flat for 1 hour. 12 tablet 3  . FLUoxetine (PROZAC) 10 MG tablet Take 1 tablet (10 mg total) by mouth daily. For depression. 30 tablet 1   No current facility-administered medications on file prior to visit.     BP 124/74   Pulse 63   Temp 98 F (36.7 C) (Oral)   Ht 5\' 3"  (1.6 m)   Wt 111 lb 4 oz (50.5 kg)   SpO2 98%   BMI 19.71 kg/m    Objective:   Physical Exam  Constitutional: She appears well-nourished.  Neck: Neck supple.  Cardiovascular: Normal rate and regular rhythm.  Respiratory: Effort normal and breath sounds normal.  Skin: Skin is warm and dry.  Psychiatric: She has a normal mood and affect.           Assessment & Plan:

## 2019-02-19 NOTE — Assessment & Plan Note (Signed)
Exam today consistent for allergy involvement.  Exam overall benign. Continue antihistamine, discussed use of Flonase.

## 2019-03-03 ENCOUNTER — Other Ambulatory Visit: Payer: Self-pay | Admitting: Primary Care

## 2019-03-03 DIAGNOSIS — F331 Major depressive disorder, recurrent, moderate: Secondary | ICD-10-CM

## 2019-03-10 ENCOUNTER — Telehealth: Payer: Self-pay

## 2019-03-10 NOTE — Telephone Encounter (Signed)
Patient contacted office to cancel her 03/12/19 colonoscopy due to [place].  She will call back to reschedule.  No Cancellation Fee  Thanks Sharyn Lull

## 2019-03-12 ENCOUNTER — Ambulatory Visit: Admission: RE | Admit: 2019-03-12 | Payer: Medicare HMO | Source: Home / Self Care | Admitting: Gastroenterology

## 2019-03-12 ENCOUNTER — Encounter: Admission: RE | Payer: Self-pay | Source: Home / Self Care

## 2019-03-12 SURGERY — COLONOSCOPY WITH PROPOFOL
Anesthesia: General

## 2019-05-21 ENCOUNTER — Other Ambulatory Visit: Payer: Self-pay | Admitting: Primary Care

## 2019-05-21 DIAGNOSIS — F331 Major depressive disorder, recurrent, moderate: Secondary | ICD-10-CM

## 2019-06-20 ENCOUNTER — Other Ambulatory Visit: Payer: Self-pay | Admitting: Primary Care

## 2019-06-20 DIAGNOSIS — F331 Major depressive disorder, recurrent, moderate: Secondary | ICD-10-CM

## 2019-07-02 ENCOUNTER — Telehealth: Payer: Self-pay | Admitting: *Deleted

## 2019-07-02 DIAGNOSIS — F331 Major depressive disorder, recurrent, moderate: Secondary | ICD-10-CM

## 2019-07-02 NOTE — Telephone Encounter (Signed)
Patient called back stating that she talked with her insurance company and was advised that if Ann Cordova will change her Fluoxetine to a capsule and if she uses [facility] which is their preferred pharmacy the medication will only cost her $8 once she meets her deductible. Patient requested that the medication be changed to the capsule if Ann Cordova is okay with that and send to [facility]/.

## 2019-07-02 NOTE — Telephone Encounter (Addendum)
Patient called stating that she got a letter from her [employer] stating that they will pay better on her Fluoxetine if it is changed from a tablet to a capsule. Patient stated that if they are the same she would like the medication changed to the capsule. Patient stated [facility] but [facility]. Patient stated that she is going to call [facility] and find out the cost of the medication and will call back and let Allie Bossier NP know what pharmacy the medication change may need to go to.

## 2019-07-02 NOTE — Telephone Encounter (Signed)
Tried to patient but home number kept ringing. Left message on mobile number

## 2019-07-02 NOTE — Telephone Encounter (Signed)
Please notify patient that I am happy to make the change. How is she doing on fluoxetine since we switched her from the Zoloft? Does she feel okay on the 20 mg dose?

## 2019-07-03 MED ORDER — FLUOXETINE HCL 20 MG PO CAPS: 20.0000 mg | ORAL_CAPSULE | Freq: Every day | ORAL | 1 refills | Status: DC

## 2019-07-03 NOTE — Telephone Encounter (Signed)
Noted. Rx changed to capsules, refill provided.

## 2019-07-03 NOTE — Telephone Encounter (Signed)
Spoken and notified patient of Ann Cordova comments. Patient stated that she is doing okay on the fluoxetine 20 mg. Please sent the capsules to [facility].

## 2019-09-05 ENCOUNTER — Telehealth: Payer: Self-pay

## 2019-09-05 NOTE — Telephone Encounter (Signed)
Pt requesting med for yeast infection; pt has not taking abx recently. Pt is having a clear vaginal discharge like pudding but no odor; not a lot of itching; pt has soreness at vagina opening. Pt using monistat 7 for 4 nights with slight relief of symptoms. Pt said she feels yucky like there is something inside that needs to come out. Pt urinating more frequently with burning and pain upon urination. Pt does not want to go to [facility] for Sat clinic; pt will go to an UC in [place]. FYI to Gentry Fitz NP.

## 2019-09-06 NOTE — Telephone Encounter (Signed)
Noted. Did she go somewhere for care? How is she doing?

## 2019-09-08 NOTE — Telephone Encounter (Signed)
Tried to call patient at home and mobile. Could not leave message.

## 2019-09-10 NOTE — Telephone Encounter (Signed)
Tried to call patient at home and mobile. Could not leave message.

## 2019-12-01 ENCOUNTER — Other Ambulatory Visit: Payer: Self-pay | Admitting: Primary Care

## 2019-12-01 DIAGNOSIS — Z0000 Encounter for general adult medical examination without abnormal findings: Secondary | ICD-10-CM

## 2019-12-05 ENCOUNTER — Ambulatory Visit: Payer: Medicare Other

## 2019-12-05 ENCOUNTER — Other Ambulatory Visit: Payer: Self-pay

## 2019-12-05 ENCOUNTER — Ambulatory Visit: Payer: Medicare HMO

## 2019-12-05 ENCOUNTER — Other Ambulatory Visit (INDEPENDENT_AMBULATORY_CARE_PROVIDER_SITE_OTHER): Payer: Medicare Other

## 2019-12-05 DIAGNOSIS — Z0000 Encounter for general adult medical examination without abnormal findings: Secondary | ICD-10-CM | POA: Diagnosis not present

## 2019-12-05 LAB — COMPREHENSIVE METABOLIC PANEL
ALT: 9 U/L (ref 0–35)
AST: 17 U/L (ref 0–37)
Albumin: 4.3 g/dL (ref 3.5–5.2)
Alkaline Phosphatase: 60 U/L (ref 39–117)
BUN: 12 mg/dL (ref 6–23)
CO2: 25 mEq/L (ref 19–32)
Calcium: 9.3 mg/dL (ref 8.4–10.5)
Chloride: 103 mEq/L (ref 96–112)
Creatinine, Ser: 0.77 mg/dL (ref 0.40–1.20)
GFR: 74.6 mL/min (ref >60.00)
Glucose, Bld: 93 mg/dL (ref 70–99)
Potassium: 4.2 mEq/L (ref 3.5–5.1)
Sodium: 136 mEq/L (ref 135–145)
Total Bilirubin: 0.5 mg/dL (ref 0.2–1.2)
Total Protein: 6.9 g/dL (ref 6.0–8.3)

## 2019-12-05 LAB — LIPID PANEL
Cholesterol: 190 mg/dL (ref 0–200)
HDL: 57.6 mg/dL (ref >39.00)
LDL Cholesterol: 118 mg/dL — ABNORMAL HIGH (ref 0–99)
NonHDL: 132.36
Total CHOL/HDL Ratio: 3
Triglycerides: 73 mg/dL (ref 0.0–149.0)
VLDL: 14.6 mg/dL (ref 0.0–40.0)

## 2019-12-05 LAB — CBC
HCT: 38.2 % (ref 36.0–46.0)
Hemoglobin: 12.7 g/dL (ref 12.0–15.0)
MCHC: 33.3 g/dL (ref 30.0–36.0)
MCV: 92 fl (ref 78.0–100.0)
Platelets: 352 10*3/uL (ref 150.0–400.0)
RBC: 4.15 Mil/uL (ref 3.87–5.11)
RDW: 13.6 % (ref 11.5–15.5)
WBC: 5.8 10*3/uL (ref 4.0–10.5)

## 2019-12-05 LAB — VITAMIN B12: Vitamin B-12: 113 pg/mL — ABNORMAL LOW (ref 211–911)

## 2019-12-12 ENCOUNTER — Ambulatory Visit (INDEPENDENT_AMBULATORY_CARE_PROVIDER_SITE_OTHER): Payer: Medicare Other | Admitting: Primary Care

## 2019-12-12 ENCOUNTER — Other Ambulatory Visit: Payer: Self-pay

## 2019-12-12 ENCOUNTER — Encounter: Payer: Self-pay | Admitting: Primary Care

## 2019-12-12 VITALS — BP 110/70 | HR 67 | Temp 97.2°F | Ht 63.0 in | Wt 114.5 lb

## 2019-12-12 DIAGNOSIS — F331 Major depressive disorder, recurrent, moderate: Secondary | ICD-10-CM

## 2019-12-12 DIAGNOSIS — E538 Deficiency of other specified B group vitamins: Secondary | ICD-10-CM

## 2019-12-12 DIAGNOSIS — Z0000 Encounter for general adult medical examination without abnormal findings: Secondary | ICD-10-CM

## 2019-12-12 DIAGNOSIS — J309 Allergic rhinitis, unspecified: Secondary | ICD-10-CM

## 2019-12-12 DIAGNOSIS — M810 Age-related osteoporosis without current pathological fracture: Secondary | ICD-10-CM | POA: Diagnosis not present

## 2019-12-12 DIAGNOSIS — Z1231 Encounter for screening mammogram for malignant neoplasm of breast: Secondary | ICD-10-CM

## 2019-12-12 MED ORDER — CYANOCOBALAMIN 1000 MCG/ML IJ SOLN
1000.0000 ug | Freq: Once | INTRAMUSCULAR | Status: AC
  Administered 2019-12-12: 11:00:00 1000 ug via INTRAMUSCULAR

## 2019-12-12 NOTE — Progress Notes (Signed)
Subjective:    Patient ID: Ann Cordova, female    DOB: 10/07/1951, 68 y.o.   MRN: [id number]  HPI  This visit occurred during the SARS-CoV-2 public health emergency.  Safety protocols were in place, including screening questions prior to the visit, additional usage of staff PPE, and extensive cleaning of exam room while observing appropriate contact time as indicated for disinfecting solutions.   Ann Cordova is a 68 year old female who presents today for complete physical.  Immunizations: -Influenza: Declines  -Shingles: Declines  -Pneumonia: Completed Prevnar and Pneumovax  Diet: She endorses a healthy diet. Exercise: She is active at work, no regular exercise.  Eye exam: No recent exam Dental exam: No recent exam  Mammogram: Completed in 2020 Dexa: Completed in 2020, osteoporosis.  Colonoscopy: Due in 2020, she will call to reschedule Hep C Screen: Negative   BP Readings from Last 3 Encounters:  12/12/19 110/70  02/19/19 124/74  01/09/19 122/74   The 10-year ASCVD risk score Mikey Bussing DC Jr., et al., 2013) is: 5%   Values used to calculate the score:     Age: 65 years     Sex: Female     Is Non-Hispanic African American: No     Diabetic: No     Tobacco smoker: No     Systolic Blood Pressure: [id number] mmHg     Is BP treated: No     HDL Cholesterol: 57.6 mg/dL     Total Cholesterol: 190 mg/dL   Review of Systems  Constitutional: Negative for unexpected weight change.  HENT: Negative for rhinorrhea.   Respiratory: Negative for cough and shortness of breath.   Cardiovascular: Negative for chest pain.  Gastrointestinal: Negative for constipation and diarrhea.  Genitourinary: Negative for difficulty urinating.  Musculoskeletal: Positive for arthralgias.       Foot pain, chronic   Skin: Negative for rash.  Allergic/Immunologic: Negative for environmental allergies.  Neurological: Negative for dizziness, numbness and headaches.  Psychiatric/Behavioral: Negative for  suicidal ideas.       No past medical history on file.   Social History   Socioeconomic History  . Marital status: Married    Spouse name: Not on file  . Number of children: Not on file  . Years of education: Not on file  . Highest education level: Not on file  Occupational History  . Not on file  Tobacco Use  . Smoking status: Former Research scientist (life sciences)  . Smokeless tobacco: Never Used  Substance and Sexual Activity  . Alcohol use: No  . Drug use: Not Currently  . Sexual activity: Not on file  Other Topics Concern  . Not on file  Social History Narrative   Married.   2 children, 6 grand children.   Retired. 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    Social Determinants of Health   Financial Resource Strain:   . Difficulty of Paying Living Expenses: Not on file  Food Insecurity:   . Worried About Charity fundraiser in the Last Year: Not on file  . Ran Out of Food in the Last Year: Not on file  Transportation Needs:   . Lack of Transportation (Medical): Not on file  . Lack of Transportation (Non-Medical): Not on file  Physical Activity:   . Days of Exercise per Week: Not on file  . Minutes of Exercise per Session: Not on file  Stress:   . Feeling of Stress : Not on file  Social  Connections:   . Frequency of Communication with Friends and Family: Not on file  . Frequency of Social Gatherings with Friends and Family: Not on file  . Attends Religious Services: Not on file  . Active Member of Clubs or Organizations: Not on file  . Attends Archivist Meetings: Not on file  . Marital Status: Not on file  Intimate Partner Violence:   . Fear of Current or Ex-Partner: Not on file  . Emotionally Abused: Not on file  . Physically Abused: Not on file  . Sexually Abused: Not on file    No past surgical history on file.  No family history on file.  No Known Allergies  Current Outpatient Medications on File Prior to Visit  Medication Sig Dispense  Refill  . alendronate (FOSAMAX) 70 MG tablet Take 1 tablet by mouth once weekly on empty stomach with water only.  Do not eat for 30 minutes, do not lay flat for 1 hour. 12 tablet 3  . FLUoxetine (PROZAC) 20 MG capsule Take 1 capsule (20 mg total) by mouth daily. 90 capsule 1   No current facility-administered medications on file prior to visit.    BP 110/70   Pulse 67   Temp (!) 97.2 F (36.2 C) (Temporal)   Ht 5\' 3"  (1.6 m)   Wt 114 lb 8 oz (51.9 kg)   SpO2 98%   BMI 20.28 kg/m    Objective:   Physical Exam  Constitutional: She is oriented to person, place, and time. She appears well-nourished.  HENT:  Right Ear: Tympanic membrane and ear canal normal.  Left Ear: Tympanic membrane and ear canal normal.  Mouth/Throat: Oropharynx is clear and moist.  Eyes: Pupils are equal, round, and reactive to light. EOM are normal.  Cardiovascular: Normal rate and regular rhythm.  Respiratory: Effort normal and breath sounds normal.  GI: Soft. Bowel sounds are normal. There is no abdominal tenderness.  Musculoskeletal:        General: Normal range of motion.     Cervical back: Neck supple.  Neurological: She is alert and oriented to person, place, and time. No cranial nerve deficit.  Reflex Scores:      Patellar reflexes are 2+ on the right side and 2+ on the left side. Skin: Skin is warm and dry.  Psychiatric: She has a normal mood and affect.           Assessment & Plan:

## 2019-12-12 NOTE — Assessment & Plan Note (Signed)
Overall doing okay on fluoxetine capsules, she did feel better with Zoloft but could not tolerate due to diarrhea. She opts to continue with fluoxetine 20 mg for now and will update if she'd like to try a different medication.  Denies SI/HI.

## 2019-12-12 NOTE — Patient Instructions (Addendum)
Start taking 1200 mg of calcium and 800 units of vitamin D daily.   Continue to remain active during the day to help with bone strength.  It's important to improve your diet by reducing consumption of fast food, fried food, processed snack foods, sugary drinks. Increase consumption of fresh vegetables and fruits, whole grains, water.  Ensure you are drinking 64 ounces of water daily.  Call the breast center to schedule your mammogram.  Schedule nurse visits for weekly B12 injections for an additional three weeks after this week...  then schedule nurse visits for B12 injection every other week for one month...  then schedule nurse visits for B12 injection once monthly thereafter.  Schedule a lab only appointment for 3 months for B12 check.  It was a pleasure to see you today!   Preventive Care 9 Years and Older, Female Preventive care refers to lifestyle choices and visits with your health care provider that can promote health and wellness. This includes:  A yearly physical exam. This is also called an annual well check.  Regular dental and eye exams.  Immunizations.  Screening for certain conditions.  Healthy lifestyle choices, such as diet and exercise. What can I expect for my preventive care visit? Physical exam Your health care provider will check:  Height and weight. These may be used to calculate body mass index (BMI), which is a measurement that tells if you are at a healthy weight.  Heart rate and blood pressure.  Your skin for abnormal spots. Counseling Your health care provider may ask you questions about:  Alcohol, tobacco, and drug use.  Emotional well-being.  Home and relationship well-being.  Sexual activity.  Eating habits.  History of falls.  Memory and ability to understand (cognition).  Work and work Statistician.  Pregnancy and menstrual history. What immunizations do I need?  Influenza (flu) vaccine  This is recommended every  year. Tetanus, diphtheria, and pertussis (Tdap) vaccine  You may need a Td booster every 10 years. Varicella (chickenpox) vaccine  You may need this vaccine if you have not already been vaccinated. Zoster (shingles) vaccine  You may need this after age 33. Pneumococcal conjugate (PCV13) vaccine  One dose is recommended after age 26. Pneumococcal polysaccharide (PPSV23) vaccine  One dose is recommended after age 23. Measles, mumps, and rubella (MMR) vaccine  You may need at least one dose of MMR if you were born in 1957 or later. You may also need a second dose. Meningococcal conjugate (MenACWY) vaccine  You may need this if you have certain conditions. Hepatitis A vaccine  You may need this if you have certain conditions or if you travel or work in places where you may be exposed to hepatitis A. Hepatitis B vaccine  You may need this if you have certain conditions or if you travel or work in places where you may be exposed to hepatitis B. Haemophilus influenzae type b (Hib) vaccine  You may need this if you have certain conditions. You may receive vaccines as individual doses or as more than one vaccine together in one shot (combination vaccines). Talk with your health care provider about the risks and benefits of combination vaccines. What tests do I need? Blood tests  Lipid and cholesterol levels. These may be checked every 5 years, or more frequently depending on your overall health.  Hepatitis C test.  Hepatitis B test. Screening  Lung cancer screening. You may have this screening every year starting at age 81 if you have a  30-pack-year history of smoking and currently smoke or have quit within the past 15 years.  Colorectal cancer screening. All adults should have this screening starting at age 16 and continuing until age 57. Your health care provider may recommend screening at age 71 if you are at increased risk. You will have tests every 1-10 years, depending on  your results and the type of screening test.  Diabetes screening. This is done by checking your blood sugar (glucose) after you have not eaten for a while (fasting). You may have this done every 1-3 years.  Mammogram. This may be done every 1-2 years. Talk with your health care provider about how often you should have regular mammograms.  BRCA-related cancer screening. This may be done if you have a family history of breast, ovarian, tubal, or peritoneal cancers. Other tests  Sexually transmitted disease (STD) testing.  Bone density scan. This is done to screen for osteoporosis. You may have this done starting at age 41. Follow these instructions at home: Eating and drinking  Eat a diet that includes fresh fruits and vegetables, whole grains, lean protein, and low-fat dairy products. Limit your intake of foods with high amounts of sugar, saturated fats, and salt.  Take vitamin and mineral supplements as recommended by your health care provider.  Do not drink alcohol if your health care provider tells you not to drink.  If you drink alcohol: ? Limit how much you have to 0-1 drink a day. ? Be aware of how much alcohol is in your drink. In the [place] one drink equals one 12 oz bottle of beer (355 mL), one 5 oz glass of wine (148 mL), or one 1 oz glass of hard liquor (44 mL). Lifestyle  Take daily care of your teeth and gums.  Stay active. Exercise for at least 30 minutes on 5 or more days each week.  Do not use any products that contain nicotine or tobacco, such as cigarettes, e-cigarettes, and chewing tobacco. If you need help quitting, ask your health care provider.  If you are sexually active, practice safe sex. Use a condom or other form of protection in order to prevent STIs (sexually transmitted infections).  Talk with your health care provider about taking a low-dose aspirin or statin. What's next?  Go to your health care provider once a year for a well check visit.  Ask  your health care provider how often you should have your eyes and teeth checked.  Stay up to date on all vaccines. This information is not intended to replace advice given to you by your health care provider. Make sure you discuss any questions you have with your health care provider. Document Released: 01/07/2016 Document Revised: 12/05/2018 Document Reviewed: 12/05/2018 Elsevier Patient Education  2020 [employer].

## 2019-12-12 NOTE — Assessment & Plan Note (Signed)
Recent level of 113. Start B12 injections weekly x one month, then biweekly x one month, then monthly. Recheck in 3 months.

## 2019-12-12 NOTE — Assessment & Plan Note (Signed)
Declines influenza and Shingles vaccines. Pneumonia vaccinations UTD. Mammogram due in early 2021. Colonoscopy due, she will call to schedule.  Bone density scan UTD. Encouraged continued weight bearing activity. Exam today stable. Labs reviewed.

## 2019-12-12 NOTE — Assessment & Plan Note (Signed)
Chronic, stable on OTC treatment PRN. Continue same.

## 2019-12-12 NOTE — Assessment & Plan Note (Signed)
Significant and noted on bone density scan from 2020. Compliant to alendronate and vitamin D. Discussed to add calcium. She is walking daily, commended her on this.  Repeat bone density scan due in 2022.

## 2019-12-12 NOTE — Addendum Note (Signed)
Addended by: Jacqualin Combes on: 12/12/2019 03:25 PM   Modules accepted: Orders

## 2019-12-21 ENCOUNTER — Other Ambulatory Visit: Payer: Self-pay | Admitting: Primary Care

## 2019-12-21 DIAGNOSIS — M810 Age-related osteoporosis without current pathological fracture: Secondary | ICD-10-CM

## 2019-12-23 ENCOUNTER — Other Ambulatory Visit: Payer: Self-pay

## 2019-12-23 ENCOUNTER — Telehealth: Payer: Self-pay

## 2019-12-23 ENCOUNTER — Ambulatory Visit: Payer: Medicare Other

## 2019-12-23 ENCOUNTER — Ambulatory Visit (INDEPENDENT_AMBULATORY_CARE_PROVIDER_SITE_OTHER): Payer: Medicare Other | Admitting: *Deleted

## 2019-12-23 DIAGNOSIS — E538 Deficiency of other specified B group vitamins: Secondary | ICD-10-CM | POA: Diagnosis not present

## 2019-12-23 MED ORDER — CYANOCOBALAMIN 1000 MCG/ML IJ SOLN
1000.0000 ug | Freq: Once | INTRAMUSCULAR | Status: AC
  Administered 2019-12-23: 1000 ug via INTRAMUSCULAR

## 2019-12-23 NOTE — Progress Notes (Signed)
.  Per orders of Dr. Diona Browner (in absence for Allie Bossier, NP), injection of B12 injection 1000 mcg/mL given by Asmi Fugere. Patient tolerated injection well.  This is patient 2nd weekly injection. Advise patient that must call office to schedule her 3rd weekly for the upcoming week.

## 2019-12-23 NOTE — Telephone Encounter (Signed)
Noted. Injection already done.

## 2019-12-23 NOTE — Telephone Encounter (Signed)
[facility] - Client Nonclinical Telephone Record AccessNurse Client [place] [facility] - Client Client Site [place] Physician Alma Friendly - NP Contact Type Call Who Is Calling Patient / Member / Family / Caregiver Caller Name Azyria Torello Phone Number [phone] Patient Name Ann Cordova Patient DOB 07-Sep-1951 Call Type Message Only Information Provided Reason for Call Request for General Office Information Initial Comment Caller is here to get a shot. [name] 10/23/1951 [id number] Additional Comment Cller is waiting in the parking lot to come in and have her shot. Disp. Time Disposition Final User 12/23/2019 8:02:37 AM General Information Provided Yes King-Hussey, Berdi Call Closed By: Bonnita Nasuti Transaction Date/Time: 12/23/2019 7:56:43 AM (ET)

## 2019-12-30 ENCOUNTER — Telehealth: Payer: Self-pay | Admitting: Primary Care

## 2019-12-30 NOTE — Telephone Encounter (Signed)
For this week it is #3 then just 1 more weekly.  Per Ann Cordova notes  nurse visits for weekly B12 injections   then schedule nurse visits for B12 injection every other week for one month...  then schedule nurse visits for B12 injection once monthly thereafter.  Schedule a lab only appointment for 3 months for B12 check.

## 2019-12-30 NOTE — Telephone Encounter (Signed)
Weekly for how long??

## 2019-12-30 NOTE — Telephone Encounter (Signed)
Pt came by while we were in meeting for her b12 inj.  I told her you were in a meeting and it would be 20 min before you could comeout to get injection. Pt didn't want to wait I also told her she needed to make appointments for her injection    When can these be schedule.  Next nurse visit 1/21??

## 2019-12-30 NOTE — Telephone Encounter (Signed)
She needs weekly B12 injection right now. I can do it on Thursday at 12pm or something like that.

## 2019-12-31 ENCOUNTER — Other Ambulatory Visit: Payer: Self-pay | Admitting: Primary Care

## 2019-12-31 DIAGNOSIS — F331 Major depressive disorder, recurrent, moderate: Secondary | ICD-10-CM

## 2019-12-31 MED ORDER — FLUOXETINE HCL 20 MG PO CAPS: 20.0000 mg | ORAL_CAPSULE | Freq: Every day | ORAL | 1 refills | Status: DC

## 2019-12-31 NOTE — Telephone Encounter (Signed)
Pt schedule nurse visit 1/7 and1/14

## 2020-01-01 ENCOUNTER — Other Ambulatory Visit: Payer: Self-pay

## 2020-01-01 ENCOUNTER — Ambulatory Visit (INDEPENDENT_AMBULATORY_CARE_PROVIDER_SITE_OTHER): Payer: Medicare Other | Admitting: *Deleted

## 2020-01-01 DIAGNOSIS — E538 Deficiency of other specified B group vitamins: Secondary | ICD-10-CM | POA: Diagnosis not present

## 2020-01-01 MED ORDER — CYANOCOBALAMIN 1000 MCG/ML IJ SOLN
1000.0000 ug | Freq: Once | INTRAMUSCULAR | Status: AC
  Administered 2020-01-01: 1000 ug via INTRAMUSCULAR

## 2020-01-01 NOTE — Progress Notes (Addendum)
Per orders of Allie Bossier, NP, injection of B12 1000 mcg/mL given by Mittie Knittel. Patient tolerated injection well.   this week it is #3 then just 1 more weekly.  Per Anda Kraft notes  nurse visits for weekly B12 injections for one month  then schedule nurse visits for B12 injection every other week for one month...  then schedule nurse visits for B12 injection once monthly thereafter.  Schedule a lab only appointment for 3 months for B12 check.

## 2020-01-08 ENCOUNTER — Ambulatory Visit (INDEPENDENT_AMBULATORY_CARE_PROVIDER_SITE_OTHER): Payer: Medicare Other | Admitting: *Deleted

## 2020-01-08 ENCOUNTER — Other Ambulatory Visit: Payer: Self-pay

## 2020-01-08 DIAGNOSIS — E538 Deficiency of other specified B group vitamins: Secondary | ICD-10-CM | POA: Diagnosis not present

## 2020-01-08 MED ORDER — CYANOCOBALAMIN 1000 MCG/ML IJ SOLN
1000.0000 ug | Freq: Once | INTRAMUSCULAR | Status: AC
  Administered 2020-01-08: 1000 ug via INTRAMUSCULAR

## 2020-01-08 NOTE — Progress Notes (Signed)
Per orders of Kate Clark, NP, injection of Vit B12 given by Graycee Greeson M. Patient tolerated injection well.  

## 2020-02-12 ENCOUNTER — Ambulatory Visit: Payer: Medicare Other

## 2020-02-19 ENCOUNTER — Other Ambulatory Visit: Payer: Self-pay

## 2020-02-19 ENCOUNTER — Ambulatory Visit (INDEPENDENT_AMBULATORY_CARE_PROVIDER_SITE_OTHER): Payer: Medicare Other

## 2020-02-19 DIAGNOSIS — E538 Deficiency of other specified B group vitamins: Secondary | ICD-10-CM

## 2020-02-19 MED ORDER — CYANOCOBALAMIN 1000 MCG/ML IJ SOLN
1000.0000 ug | Freq: Once | INTRAMUSCULAR | Status: AC
  Administered 2020-02-19: 1000 ug via INTRAMUSCULAR

## 2020-02-19 NOTE — Progress Notes (Signed)
Per orders of Allie Bossier, NP, injection of B12 given by Pilar Grammes, CMA in right deltoid. Patient tolerated injection well.  Discussed with Anda Kraft prior to giving injection. Pt was to do 4 weekly injections then 2 bimonthly injections, then monthly. Test in 3 months.   She did not get the 1st bimonthly injection.  Anda Kraft advised that she get it today and test in 1 month as planned.  Gave pt her injection and explained to her to schedule a lab visit and B12 injection in 1 month.   Pt advised me that she told the person giving her the injection 1 month ago that she thought she was to do bimonthly. The person told her she was to get it monthly.

## 2020-03-05 ENCOUNTER — Other Ambulatory Visit: Payer: Self-pay | Admitting: Primary Care

## 2020-03-05 DIAGNOSIS — E538 Deficiency of other specified B group vitamins: Secondary | ICD-10-CM

## 2020-03-18 ENCOUNTER — Other Ambulatory Visit (INDEPENDENT_AMBULATORY_CARE_PROVIDER_SITE_OTHER): Payer: Medicare Other

## 2020-03-18 ENCOUNTER — Other Ambulatory Visit: Payer: Medicare Other

## 2020-03-18 ENCOUNTER — Other Ambulatory Visit: Payer: Self-pay

## 2020-03-18 DIAGNOSIS — E538 Deficiency of other specified B group vitamins: Secondary | ICD-10-CM | POA: Diagnosis not present

## 2020-03-18 LAB — VITAMIN B12: Vitamin B-12: 462 pg/mL (ref 211–911)

## 2020-04-05 DIAGNOSIS — H2513 Age-related nuclear cataract, bilateral: Secondary | ICD-10-CM | POA: Diagnosis not present

## 2020-04-21 ENCOUNTER — Other Ambulatory Visit: Payer: Self-pay

## 2020-04-21 ENCOUNTER — Ambulatory Visit (INDEPENDENT_AMBULATORY_CARE_PROVIDER_SITE_OTHER): Payer: Medicare Other

## 2020-04-21 DIAGNOSIS — E538 Deficiency of other specified B group vitamins: Secondary | ICD-10-CM

## 2020-04-21 MED ORDER — CYANOCOBALAMIN 1000 MCG/ML IJ SOLN
1000.0000 ug | Freq: Once | INTRAMUSCULAR | Status: AC
  Administered 2020-04-21: 16:00:00 1000 ug via INTRAMUSCULAR

## 2020-04-21 NOTE — Progress Notes (Signed)
Per orders of Kate Clark, NP, injection of B12 given by Shaquan Missey. Patient tolerated injection well.  

## 2020-06-02 ENCOUNTER — Ambulatory Visit (INDEPENDENT_AMBULATORY_CARE_PROVIDER_SITE_OTHER): Payer: Medicare Other

## 2020-06-02 DIAGNOSIS — E538 Deficiency of other specified B group vitamins: Secondary | ICD-10-CM | POA: Diagnosis not present

## 2020-06-02 MED ORDER — CYANOCOBALAMIN 1000 MCG/ML IJ SOLN
1000.0000 ug | Freq: Once | INTRAMUSCULAR | Status: AC
  Administered 2020-06-02: 1000 ug via INTRAMUSCULAR

## 2020-06-02 NOTE — Progress Notes (Signed)
Per orders of Allie Bossier, NP, injection of B12 given by Randall An. Patient tolerated injection well.

## 2020-07-02 ENCOUNTER — Other Ambulatory Visit: Payer: Self-pay | Admitting: Primary Care

## 2020-07-02 DIAGNOSIS — F331 Major depressive disorder, recurrent, moderate: Secondary | ICD-10-CM

## 2020-07-06 ENCOUNTER — Other Ambulatory Visit: Payer: Self-pay

## 2020-07-06 ENCOUNTER — Ambulatory Visit (INDEPENDENT_AMBULATORY_CARE_PROVIDER_SITE_OTHER): Payer: Medicare Other

## 2020-07-06 DIAGNOSIS — E538 Deficiency of other specified B group vitamins: Secondary | ICD-10-CM

## 2020-07-06 MED ORDER — CYANOCOBALAMIN 1000 MCG/ML IJ SOLN
1000.0000 ug | Freq: Once | INTRAMUSCULAR | Status: AC
  Administered 2020-07-06: 1000 ug via INTRAMUSCULAR

## 2020-07-06 NOTE — Progress Notes (Signed)
Per Anda Kraft Clark's orders, pt given 3rd of 4 monthly B12 injections in Right Deltoid. Pt tolerated well.  She missed the month of May. She is to get another shot in August, then get labs done prior to September injection. I had pt make August appt before leaving.

## 2020-08-10 ENCOUNTER — Ambulatory Visit (INDEPENDENT_AMBULATORY_CARE_PROVIDER_SITE_OTHER): Payer: Medicare Other

## 2020-08-10 ENCOUNTER — Other Ambulatory Visit: Payer: Self-pay

## 2020-08-10 DIAGNOSIS — E538 Deficiency of other specified B group vitamins: Secondary | ICD-10-CM

## 2020-08-10 MED ORDER — CYANOCOBALAMIN 1000 MCG/ML IJ SOLN
1000.0000 ug | Freq: Once | INTRAMUSCULAR | Status: AC
  Administered 2020-08-10: 1000 ug via INTRAMUSCULAR

## 2020-08-10 NOTE — Progress Notes (Signed)
Per orders of Webb Silversmith, NP, injection of vit E10 given by Brenton Grills. Patient tolerated injection well.

## 2021-01-01 ENCOUNTER — Other Ambulatory Visit: Payer: Self-pay | Admitting: Primary Care

## 2021-01-01 DIAGNOSIS — F331 Major depressive disorder, recurrent, moderate: Secondary | ICD-10-CM

## 2021-01-02 ENCOUNTER — Telehealth: Payer: Self-pay | Admitting: Primary Care

## 2021-01-02 DIAGNOSIS — F331 Major depressive disorder, recurrent, moderate: Secondary | ICD-10-CM

## 2021-01-03 NOTE — Telephone Encounter (Signed)
We have not seen patient in over 1 year, she will need an office visit for further refills. We provided her with a 30-day supply on January 01, 2021.

## 2021-01-03 NOTE — Telephone Encounter (Signed)
Pease advise on refill. Thank you.  Last OV was in 11/2019.

## 2021-01-07 NOTE — Telephone Encounter (Signed)
Called patient to schedule cpe/ follow up. Unable to connect to pt as line would get picked up then disconnected. Will attempt again later.

## 2021-01-17 NOTE — Telephone Encounter (Signed)
Called patient to schedule cpe. Scheduled. Please advise.

## 2021-02-04 ENCOUNTER — Ambulatory Visit (INDEPENDENT_AMBULATORY_CARE_PROVIDER_SITE_OTHER): Payer: Medicare HMO

## 2021-02-04 ENCOUNTER — Other Ambulatory Visit: Payer: Self-pay

## 2021-02-04 DIAGNOSIS — Z0000 Encounter for general adult medical examination without abnormal findings: Secondary | ICD-10-CM | POA: Diagnosis not present

## 2021-02-04 NOTE — Progress Notes (Signed)
PCP notes:  Health Maintenance: Colonoscopy- declined Mammogram- declined dexa- declined Flu- declined Covid- declined   Abnormal Screenings: none   Patient concerns: none   Nurse concerns: none   Next PCP appt: 02/09/2021 @ 2 pm

## 2021-02-04 NOTE — Patient Instructions (Signed)
[name] , Thank you for taking time to come for your Medicare Wellness Visit. I appreciate your ongoing commitment to your health goals. Please review the following plan we discussed and let me know if I can assist you in the future.   Screening recommendations/referrals: Colonoscopy: declined Mammogram: declined Bone Density: declined Recommended yearly ophthalmology/optometry visit for glaucoma screening and checkup Recommended yearly dental visit for hygiene and checkup  Vaccinations: Influenza vaccine: declined Pneumococcal vaccine: Completed series Tdap vaccine: decline-insurance Shingles vaccine: due, check with your insurance regarding coverage if interested   Covid-19:declined  Advanced directives: Advance directive discussed with you today. Even though you declined this today please call our office should you change your mind and we can give you the proper paperwork for you to fill out.   Conditions/risks identified: none  Next appointment: Follow up in one year for your annual wellness visit    Preventive Care 65 Years and Older, Female Preventive care refers to lifestyle choices and visits with your health care provider that can promote health and wellness. What does preventive care include?  A yearly physical exam. This is also called an annual well check.  Dental exams once or twice a year.  Routine eye exams. Ask your health care provider how often you should have your eyes checked.  Personal lifestyle choices, including:  Daily care of your teeth and gums.  Regular physical activity.  Eating a healthy diet.  Avoiding tobacco and drug use.  Limiting alcohol use.  Practicing safe sex.  Taking low-dose aspirin every day.  Taking vitamin and mineral supplements as recommended by your health care provider. What happens during an annual well check? The services and screenings done by your health care provider during your annual well check will depend on  your age, overall health, lifestyle risk factors, and family history of disease. Counseling  Your health care provider may ask you questions about your:  Alcohol use.  Tobacco use.  Drug use.  Emotional well-being.  Home and relationship well-being.  Sexual activity.  Eating habits.  History of falls.  Memory and ability to understand (cognition).  Work and work Statistician.  Reproductive health. Screening  You may have the following tests or measurements:  Height, weight, and BMI.  Blood pressure.  Lipid and cholesterol levels. These may be checked every 5 years, or more frequently if you are over 7 years old.  Skin check.  Lung cancer screening. You may have this screening every year starting at age 53 if you have a 30-pack-year history of smoking and currently smoke or have quit within the past 15 years.  Fecal occult blood test (FOBT) of the stool. You may have this test every year starting at age 69.  Flexible sigmoidoscopy or colonoscopy. You may have a sigmoidoscopy every 5 years or a colonoscopy every 10 years starting at age 56.  Hepatitis C blood test.  Hepatitis B blood test.  Sexually transmitted disease (STD) testing.  Diabetes screening. This is done by checking your blood sugar (glucose) after you have not eaten for a while (fasting). You may have this done every 1-3 years.  Bone density scan. This is done to screen for osteoporosis. You may have this done starting at age 1.  Mammogram. This may be done every 1-2 years. Talk to your health care provider about how often you should have regular mammograms. Talk with your health care provider about your test results, treatment options, and if necessary, the need for more tests. Vaccines  Your health care provider may recommend certain vaccines, such as:  Influenza vaccine. This is recommended every year.  Tetanus, diphtheria, and acellular pertussis (Tdap, Td) vaccine. You may need a Td booster  every 10 years.  Zoster vaccine. You may need this after age 31.  Pneumococcal 13-valent conjugate (PCV13) vaccine. One dose is recommended after age 47.  Pneumococcal polysaccharide (PPSV23) vaccine. One dose is recommended after age 60. Talk to your health care provider about which screenings and vaccines you need and how often you need them. This information is not intended to replace advice given to you by your health care provider. Make sure you discuss any questions you have with your health care provider. Document Released: 01/07/2016 Document Revised: 08/30/2016 Document Reviewed: 10/12/2015 Elsevier Interactive Patient Education  2017 [employer] Prevention in the Home Falls can cause injuries. They can happen to people of all ages. There are many things you can do to make your home safe and to help prevent falls. What can I do on the outside of my home?  Regularly fix the edges of walkways and driveways and fix any cracks.  Remove anything that might make you trip as you walk through a door, such as a raised step or threshold.  Trim any bushes or trees on the path to your home.  Use bright outdoor lighting.  Clear any walking paths of anything that might make someone trip, such as rocks or tools.  Regularly check to see if handrails are loose or broken. Make sure that both sides of any steps have handrails.  Any raised decks and porches should have guardrails on the edges.  Have any leaves, snow, or ice cleared regularly.  Use sand or salt on walking paths during winter.  Clean up any spills in your garage right away. This includes oil or grease spills. What can I do in the bathroom?  Use night lights.  Install grab bars by the toilet and in the tub and shower. Do not use towel bars as grab bars.  Use non-skid mats or decals in the tub or shower.  If you need to sit down in the shower, use a plastic, non-slip stool.  Keep the floor dry. Clean up any  water that spills on the floor as soon as it happens.  Remove soap buildup in the tub or shower regularly.  Attach bath mats securely with double-sided non-slip rug tape.  Do not have throw rugs and other things on the floor that can make you trip. What can I do in the bedroom?  Use night lights.  Make sure that you have a light by your bed that is easy to reach.  Do not use any sheets or blankets that are too big for your bed. They should not hang down onto the floor.  Have a firm chair that has side arms. You can use this for support while you get dressed.  Do not have throw rugs and other things on the floor that can make you trip. What can I do in the kitchen?  Clean up any spills right away.  Avoid walking on wet floors.  Keep items that you use a lot in easy-to-reach places.  If you need to reach something above you, use a strong step stool that has a grab bar.  Keep electrical cords out of the way.  Do not use floor polish or wax that makes floors slippery. If you must use wax, use non-skid floor wax.  Do  not have throw rugs and other things on the floor that can make you trip. What can I do with my stairs?  Do not leave any items on the stairs.  Make sure that there are handrails on both sides of the stairs and use them. Fix handrails that are broken or loose. Make sure that handrails are as long as the stairways.  Check any carpeting to make sure that it is firmly attached to the stairs. Fix any carpet that is loose or worn.  Avoid having throw rugs at the top or bottom of the stairs. If you do have throw rugs, attach them to the floor with carpet tape.  Make sure that you have a light switch at the top of the stairs and the bottom of the stairs. If you do not have them, ask someone to add them for you. What else can I do to help prevent falls?  Wear shoes that:  Do not have high heels.  Have rubber bottoms.  Are comfortable and fit you well.  Are closed  at the toe. Do not wear sandals.  If you use a stepladder:  Make sure that it is fully opened. Do not climb a closed stepladder.  Make sure that both sides of the stepladder are locked into place.  Ask someone to hold it for you, if possible.  Clearly mark and make sure that you can see:  Any grab bars or handrails.  First and last steps.  Where the edge of each step is.  Use tools that help you move around (mobility aids) if they are needed. These include:  Canes.  Walkers.  Scooters.  Crutches.  Turn on the lights when you go into a dark area. Replace any light bulbs as soon as they burn out.  Set up your furniture so you have a clear path. Avoid moving your furniture around.  If any of your floors are uneven, fix them.  If there are any pets around you, be aware of where they are.  Review your medicines with your doctor. Some medicines can make you feel dizzy. This can increase your chance of falling. Ask your doctor what other things that you can do to help prevent falls. This information is not intended to replace advice given to you by your health care provider. Make sure you discuss any questions you have with your health care provider. Document Released: 10/07/2009 Document Revised: 05/18/2016 Document Reviewed: 01/15/2015 Elsevier Interactive Patient Education  2017 [employer].

## 2021-02-04 NOTE — Progress Notes (Addendum)
Subjective:   Ann Cordova is a 70 y.o. female who presents for Medicare Annual (Subsequent) preventive examination.  Review of Systems: N/A      I connected with the patient today by telephone and verified that I am speaking with the correct person using two identifiers. Location patient: home Location nurse: work Persons participating in the telephone visit: patient, nurse.   I discussed the limitations, risks, security and privacy concerns of performing an evaluation and management service by telephone and the availability of in person appointments. I also discussed with the patient that there may be a patient responsible charge related to this service. The patient expressed understanding and verbally consented to this telephonic visit.        Cardiac Risk Factors include: advanced age (>58men, >64 women)     Objective:    Today's Vitals   There is no height or weight on file to calculate BMI.  Advanced Directives 02/04/2021 12/02/2018  Does Patient Have a Medical Advance Directive? No No  Would patient like information on creating a medical advance directive? No - Patient declined Yes (MAU/Ambulatory/Procedural Areas - Information given)    Current Medications (verified) Outpatient Encounter Medications as of 02/04/2021  Medication Sig  . alendronate (FOSAMAX) 70 MG tablet TAKE 1 TABLET BY MOUTH WEEKLY ON AN EMPTY STOMACH WITH WATER ONLY. NO FOOD FOR 30 MINUTES AND DO NOT LAY DOWN FOR ONE HOURS AFTER  . FLUoxetine (PROZAC) 20 MG capsule TAKE 1 CAPSULE(20 MG) BY MOUTH DAILY   No facility-administered encounter medications on file as of 02/04/2021.    Allergies (verified) Patient has no known allergies.   History: History reviewed. No pertinent past medical history. History reviewed. No pertinent surgical history. History reviewed. No pertinent family history. Social History   Socioeconomic History  . Marital status: Married    Spouse name: Not on file  . Number  of children: Not on file  . Years of education: Not on file  . Highest education level: Not on file  Occupational History  . Not on file  Tobacco Use  . Smoking status: Former Research scientist (life sciences)  . Smokeless tobacco: Never Used  Vaping Use  . Vaping Use: Never used  Substance and Sexual Activity  . Alcohol use: No  . Drug use: Not Currently  . Sexual activity: Not on file  Other Topics Concern  . Not on file  Social History Narrative   Married.   2 children, 6 grand children.   Retired. 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    Social Determinants of Health   Financial Resource Strain: Low Risk   . Difficulty of Paying Living Expenses: Not hard at all  Food Insecurity: No Food Insecurity  . Worried About Charity fundraiser in the Last Year: Never true  . Ran Out of Food in the Last Year: Never true  Transportation Needs: No Transportation Needs  . Lack of Transportation (Medical): No  . Lack of Transportation (Non-Medical): No  Physical Activity: Inactive  . Days of Exercise per Week: 0 days  . Minutes of Exercise per Session: 0 min  Stress: No Stress Concern Present  . Feeling of Stress : Not at all  Social Connections: Not on file    Tobacco Counseling Counseling given: Not Answered   Clinical Intake:  Pre-visit preparation completed: Yes  Pain : No/denies pain     Nutritional Risks: Nausea/ vomitting/ diarrhea (diarrhea at times) Diabetes: No  How often  do you need to have someone help you when you read instructions, pamphlets, or other written materials from your doctor or pharmacy?: 1 - Never What is the last grade level you completed in school?: 1 year of college  Diabetic: No Nutrition Risk Assessment:  Has the patient had any N/V/D within the last 2 months?  Yes , diarrhea Does the patient have any non-healing wounds?  No  Has the patient had any unintentional weight loss or weight gain?  No   Diabetes:  Is the patient diabetic?  No   If diabetic, was a CBG obtained today?  N/A Did the patient bring in their glucometer from home?  N/A How often do you monitor your CBG's? N/A.   Financial Strains and Diabetes Management:  Are you having any financial strains with the device, your supplies or your medication? N/A.  Does the patient want to be seen by Chronic Care Management for management of their diabetes?  N/A Would the patient like to be referred to a Nutritionist or for Diabetic Management?  N/A   Interpreter Needed?: No  Information entered by :: CJohnson, LPN   Activities of Daily Living In your present state of health, do you have any difficulty performing the following activities: 02/04/2021  Hearing? N  Vision? Y  Comment Patient says her eye is not good. Has seen her eye doctor  Difficulty concentrating or making decisions? N  Walking or climbing stairs? N  Dressing or bathing? N  Doing errands, shopping? N  Preparing Food and eating ? N  Using the Toilet? N  In the past six months, have you accidently leaked urine? Y  Comment wears pads  Do you have problems with loss of bowel control? Y  Comment wears pads  Managing your Medications? N  Managing your Finances? N  Housekeeping or managing your Housekeeping? N  Some recent data might be hidden    Patient Care Team: Pleas Koch, NP as PCP - General (Internal Medicine)  Indicate any recent Medical Services you may have received from other than [facility] providers in the past year (date may be approximate).     Assessment:   This is a routine wellness examination for Ann Cordova.  Hearing/Vision screen  Hearing Screening   125Hz  250Hz  500Hz  1000Hz  2000Hz  3000Hz  4000Hz  6000Hz  8000Hz   Right ear:           Left ear:           Vision Screening Comments: Patient gets annual eye exams   Dietary issues and exercise activities discussed: Current Exercise Habits: The patient does not participate in regular exercise at present, Exercise limited by:  None identified  Goals    . Patient Stated     Starting 12/02/2018,  I will continue to keep all scheduled appointments with my PCP.    Marland Kitchen Patient Stated     02/04/2021, I will maintain and continue medications as prescribed.       Depression Screen PHQ 2/9 Scores 02/04/2021 01/09/2019 12/06/2018 12/02/2018 10/10/2017  PHQ - 2 Score 0 1 4 0 0  PHQ- 9 Score 0 10 12 0 -    Fall Risk Fall Risk  02/04/2021 12/02/2018 10/10/2017  Falls in the past year? 0 0 No  Number falls in past yr: 0 - -  Injury with Fall? 0 - -  Risk for fall due to : No Fall Risks - -  Follow up Falls evaluation completed;Falls prevention discussed - -    FALL RISK  PREVENTION PERTAINING TO THE HOME:  Any stairs in or around the home? Yes  If so, are there any without handrails? No  Home free of loose throw rugs in walkways, pet beds, electrical cords, etc? Yes  Adequate lighting in your home to reduce risk of falls? Yes   ASSISTIVE DEVICES UTILIZED TO PREVENT FALLS:  Life alert? No  Use of a cane, walker or w/c? No  Grab bars in the bathroom? No  Shower chair or bench in shower? No  Elevated toilet seat or a handicapped toilet? No   TIMED UP AND GO:  Was the test performed? N/A telephone visit .   Cognitive Function: MMSE - Mini Mental State Exam 02/04/2021 12/02/2018  Orientation to time 5 5  Orientation to Place 5 5  Registration 3 3  Attention/ Calculation 5 0  Recall 3 3  Language- name 2 objects - 0  Language- repeat 1 1  Language- follow 3 step command - 3  Language- read & follow direction - 0  Write a sentence - 0  Copy design - 0  Total score - 20  Mini Cog  Mini-Cog screen was completed. Maximum score is 22. A value of 0 denotes this part of the MMSE was not completed or the patient failed this part of the Mini-Cog screening.       Immunizations Immunization History  Administered Date(s) Administered  . Influenza,inj,Quad PF,6+ Mos 10/10/2017, 12/02/2018  . Pneumococcal  Conjugate-13 10/10/2017  . Pneumococcal Polysaccharide-23 12/02/2018    TDAP status: Due, Education has been provided regarding the importance of this vaccine. Advised may receive this vaccine at local pharmacy or Health Dept. Aware to provide a copy of the vaccination record if obtained from local pharmacy or Health Dept. Verbalized acceptance and understanding.  Flu Vaccine status: Declined, Education has been provided regarding the importance of this vaccine but patient still declined. Advised may receive this vaccine at local pharmacy or Health Dept. Aware to provide a copy of the vaccination record if obtained from local pharmacy or Health Dept. Verbalized acceptance and understanding.  Pneumococcal vaccine status: Up to date  Covid-19 vaccine status: Declined, Education has been provided regarding the importance of this vaccine but patient still declined. Advised may receive this vaccine at local pharmacy or Health Dept.or vaccine clinic. Aware to provide a copy of the vaccination record if obtained from local pharmacy or Health Dept. Verbalized acceptance and understanding.  Qualifies for Shingles Vaccine? Yes   Zostavax completed No   Shingrix Completed?: No.    Education has been provided regarding the importance of this vaccine. Patient has been advised to call insurance company to determine out of pocket expense if they have not yet received this vaccine. Advised may also receive vaccine at local pharmacy or Health Dept. Verbalized acceptance and understanding.  Screening Tests Health Maintenance  Topic Date Due  . INFLUENZA VACCINE  03/24/2021 (Originally 07/25/2020)  . MAMMOGRAM  02/04/2022 (Originally 02/04/2021)  . COLONOSCOPY (Pts 45-28yrs Insurance coverage will need to be confirmed)  02/04/2022 (Originally 03/02/1997)  . COVID-19 Vaccine (1) 02/20/2023 (Originally 03/02/1956)  . TETANUS/TDAP  02/05/2024 (Originally 03/03/1971)  . DEXA SCAN  Completed  . Hepatitis C Screening   Completed  . PNA vac Low Risk Adult  Completed    Health Maintenance  There are no preventive care reminders to display for this patient.  Colorectal cancer screening: declined  Mammogram status: declined  Bone Density status:declined  Lung Cancer Screening: (Low Dose CT Chest recommended if  Age 44-80 years, 30 pack-year currently smoking OR have quit w/in 15 years.) does not qualify.    Additional Screening:  Hepatitis C Screening: does qualify; Completed 12/02/2018  Vision Screening: Recommended annual ophthalmology exams for early detection of glaucoma and other disorders of the eye. Is the patient up to date with their annual eye exam?  Yes  Who is the provider or what is the name of the office in which the patient attends annual eye exams? [facility] If pt is not established with a provider, would they like to be referred to a provider to establish care? No .   Dental Screening: Recommended annual dental exams for proper oral hygiene  Community Resource Referral / Chronic Care Management: CRR required this visit?  No   CCM required this visit?  No      Plan:     I have personally reviewed and noted the following in the patient's chart:   . Medical and social history . Use of alcohol, tobacco or illicit drugs  . Current medications and supplements . Functional ability and status . Nutritional status . Physical activity . Advanced directives . List of other physicians . Hospitalizations, surgeries, and ER visits in previous 12 months . Vitals . Screenings to include cognitive, depression, and falls . Referrals and appointments  In addition, I have reviewed and discussed with patient certain preventive protocols, quality metrics, and best practice recommendations. A written personalized care plan for preventive services as well as general preventive health recommendations were provided to patient.   Due to this being a telephonic visit, the after visit  summary with patients personalized plan was offered to patient via office or my-chart. Patient preferred to pick up at office at next visit or via mychart.   Andrez Grime, LPN   [id number]

## 2021-02-09 ENCOUNTER — Ambulatory Visit (INDEPENDENT_AMBULATORY_CARE_PROVIDER_SITE_OTHER): Payer: Medicare HMO | Admitting: Primary Care

## 2021-02-09 ENCOUNTER — Other Ambulatory Visit: Payer: Self-pay

## 2021-02-09 ENCOUNTER — Encounter: Payer: Self-pay | Admitting: Primary Care

## 2021-02-09 ENCOUNTER — Ambulatory Visit (INDEPENDENT_AMBULATORY_CARE_PROVIDER_SITE_OTHER)
Admission: RE | Admit: 2021-02-09 | Discharge: 2021-02-09 | Disposition: A | Discharge status: Home / Self Care | Payer: Medicare HMO | Source: Ambulatory Visit | Attending: Primary Care | Admitting: Primary Care

## 2021-02-09 ENCOUNTER — Ambulatory Visit
Admission: RE | Admit: 2021-02-09 | Discharge: 2021-02-09 | Disposition: A | Discharge status: Home / Self Care | Payer: Medicare HMO | Source: Ambulatory Visit | Attending: Primary Care | Admitting: Primary Care

## 2021-02-09 VITALS — BP 124/62 | HR 75 | Temp 98.6°F | Ht 63.0 in | Wt 110.0 lb

## 2021-02-09 DIAGNOSIS — G8929 Other chronic pain: Secondary | ICD-10-CM | POA: Diagnosis not present

## 2021-02-09 DIAGNOSIS — M25542 Pain in joints of left hand: Secondary | ICD-10-CM

## 2021-02-09 DIAGNOSIS — E538 Deficiency of other specified B group vitamins: Secondary | ICD-10-CM | POA: Diagnosis not present

## 2021-02-09 DIAGNOSIS — M25541 Pain in joints of right hand: Secondary | ICD-10-CM

## 2021-02-09 DIAGNOSIS — Z1231 Encounter for screening mammogram for malignant neoplasm of breast: Secondary | ICD-10-CM | POA: Diagnosis not present

## 2021-02-09 DIAGNOSIS — M2550 Pain in unspecified joint: Secondary | ICD-10-CM | POA: Insufficient documentation

## 2021-02-09 DIAGNOSIS — F331 Major depressive disorder, recurrent, moderate: Secondary | ICD-10-CM | POA: Diagnosis not present

## 2021-02-09 DIAGNOSIS — M19042 Primary osteoarthritis, left hand: Secondary | ICD-10-CM | POA: Diagnosis not present

## 2021-02-09 DIAGNOSIS — Z1211 Encounter for screening for malignant neoplasm of colon: Secondary | ICD-10-CM | POA: Diagnosis not present

## 2021-02-09 DIAGNOSIS — Z0001 Encounter for general adult medical examination with abnormal findings: Secondary | ICD-10-CM

## 2021-02-09 DIAGNOSIS — M19032 Primary osteoarthritis, left wrist: Secondary | ICD-10-CM | POA: Diagnosis not present

## 2021-02-09 DIAGNOSIS — M810 Age-related osteoporosis without current pathological fracture: Secondary | ICD-10-CM | POA: Diagnosis not present

## 2021-02-09 DIAGNOSIS — M19031 Primary osteoarthritis, right wrist: Secondary | ICD-10-CM | POA: Diagnosis not present

## 2021-02-09 DIAGNOSIS — E785 Hyperlipidemia, unspecified: Secondary | ICD-10-CM | POA: Diagnosis not present

## 2021-02-09 DIAGNOSIS — R69 Illness, unspecified: Secondary | ICD-10-CM | POA: Diagnosis not present

## 2021-02-09 DIAGNOSIS — M19041 Primary osteoarthritis, right hand: Secondary | ICD-10-CM | POA: Diagnosis not present

## 2021-02-09 LAB — COMPREHENSIVE METABOLIC PANEL
ALT: 12 U/L (ref 0–35)
AST: 17 U/L (ref 0–37)
Albumin: 4.4 g/dL (ref 3.5–5.2)
Alkaline Phosphatase: 62 U/L (ref 39–117)
BUN: 17 mg/dL (ref 6–23)
CO2: 28 mEq/L (ref 19–32)
Calcium: 9.6 mg/dL (ref 8.4–10.5)
Chloride: 103 mEq/L (ref 96–112)
Creatinine, Ser: 0.78 mg/dL (ref 0.40–1.20)
GFR: 77.76 mL/min (ref >60.00)
Glucose, Bld: 89 mg/dL (ref 70–99)
Potassium: 3.9 mEq/L (ref 3.5–5.1)
Sodium: 138 mEq/L (ref 135–145)
Total Bilirubin: 0.6 mg/dL (ref 0.2–1.2)
Total Protein: 7.3 g/dL (ref 6.0–8.3)

## 2021-02-09 LAB — VITAMIN B12: Vitamin B-12: 415 pg/mL (ref 211–911)

## 2021-02-09 LAB — CBC
HCT: 40.1 % (ref 36.0–46.0)
Hemoglobin: 13.7 g/dL (ref 12.0–15.0)
MCHC: 34.3 g/dL (ref 30.0–36.0)
MCV: 89.2 fl (ref 78.0–100.0)
Platelets: 345 10*3/uL (ref 150.0–400.0)
RBC: 4.5 Mil/uL (ref 3.87–5.11)
RDW: 13.4 % (ref 11.5–15.5)
WBC: 6.6 10*3/uL (ref 4.0–10.5)

## 2021-02-09 LAB — SEDIMENTATION RATE: Sed Rate: 8 mm/hr (ref 0–30)

## 2021-02-09 LAB — LIPID PANEL
Cholesterol: 219 mg/dL — ABNORMAL HIGH (ref 0–200)
HDL: 61 mg/dL (ref >39.00)
LDL Cholesterol: 127 mg/dL — ABNORMAL HIGH (ref 0–99)
NonHDL: 158.08
Total CHOL/HDL Ratio: 4
Triglycerides: 156 mg/dL — ABNORMAL HIGH (ref 0.0–149.0)
VLDL: 31.2 mg/dL (ref 0.0–40.0)

## 2021-02-09 LAB — C-REACTIVE PROTEIN: CRP: <1 mg/dL (ref 0.5–20.0)

## 2021-02-09 MED ORDER — ALENDRONATE SODIUM 70 MG PO TABS: ORAL_TABLET | ORAL | 3 refills | Status: DC

## 2021-02-09 MED ORDER — FLUOXETINE HCL 20 MG PO CAPS: ORAL_CAPSULE | ORAL | 3 refills | Status: DC

## 2021-02-09 NOTE — Assessment & Plan Note (Signed)
Declines Shingrix, influenza, and Covid vaccines despite recommendations.  Bone density scan and mammogram due, orders placed.  Colonoscopy overdue, referral placed to GI.   Exam today stable. Labs pending.

## 2021-02-09 NOTE — Assessment & Plan Note (Signed)
Due for repeat bone density scan due, orders placed. No longer taking calcium and vitamin D, recommended to resume.   Continue alendronate 70 mg weekly. Await bone density scan results.

## 2021-02-09 NOTE — Assessment & Plan Note (Signed)
Out of B12 supplements for several months. Repeat B12 level pending.

## 2021-02-09 NOTE — Assessment & Plan Note (Addendum)
Chronic to bilateral hands, also to feet. Given patient's daughter's positive RA tests, will check RA in patient.   Labs and xrays of hands pending.

## 2021-02-09 NOTE — Progress Notes (Signed)
Subjective:    Patient ID: Ann Cordova, female    DOB: 08-Jun-1951, 70 y.o.   MRN: [id number]  HPI  This visit occurred during the SARS-CoV-2 public health emergency.  Safety protocols were in place, including screening questions prior to the visit, additional usage of staff PPE, and extensive cleaning of exam room while observing appropriate contact time as indicated for disinfecting solutions.   Ann Cordova is a 70 year old female who presents today for complete physical.  She would also like to discuss tingling/numbness to her plantar and dorsal foot, also with yellow/thickened toenails with bluish discoloration to right great toenail.   Also with chronic joint pain to bilateral hands including fingers. Now she's now noticing locking to the right 5th digit and left 4th digit. Pain with range of motion, difficulty grasping things. Over Christmas she was working longer hours, walking all day, noticed swelling and pain to her knees, these symptoms have resolved. Daughter was diagnosed with RA about 15+ years ago, patient has never been tested.  Immunizations: -Influenza: Declines  -Shingles: Declines  -Pneumonia: Prevnar in 2018, Pneumovax in 2019 -Covid-19: Declines   Diet: She endorses a healthy diet.  Exercise: She is walking often.  Eye exam: Completed last year  Dental exam: No recent visit   Mammogram: February 2020 Dexa: February 2020, compliant to alendronate, not taking calcium and vitamin D for the last few months.  Colonoscopy: Overdue Hep C Screen: Negative  BP Readings from Last 3 Encounters:  02/09/21 124/62  12/12/19 110/70  02/19/19 124/74     Review of Systems  Constitutional: Negative for unexpected weight change.  HENT: Negative for rhinorrhea.   Eyes: Negative for visual disturbance.  Respiratory: Negative for cough and shortness of breath.   Cardiovascular: Negative for chest pain.  Gastrointestinal: Positive for constipation. Negative for diarrhea.   Genitourinary: Negative for difficulty urinating.  Musculoskeletal: Positive for arthralgias.  Skin: Negative for rash.  Allergic/Immunologic: Positive for environmental allergies.  Neurological: Negative for dizziness and numbness.  Psychiatric/Behavioral: The patient is not nervous/anxious.        History reviewed. No pertinent past medical history.   Social History   Socioeconomic History  . Marital status: Married    Spouse name: Not on file  . Number of children: Not on file  . Years of education: Not on file  . Highest education level: Not on file  Occupational History  . Not on file  Tobacco Use  . Smoking status: Former Research scientist (life sciences)  . Smokeless tobacco: Never Used  Vaping Use  . Vaping Use: Never used  Substance and Sexual Activity  . Alcohol use: No  . Drug use: Not Currently  . Sexual activity: Not on file  Other Topics Concern  . Not on file  Social History Narrative   Married.   2 children, 6 grand children.   Retired. 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    Social Determinants of Health   Financial Resource Strain: Low Risk   . Difficulty of Paying Living Expenses: Not hard at all  Food Insecurity: No Food Insecurity  . Worried About Charity fundraiser in the Last Year: Never true  . Ran Out of Food in the Last Year: Never true  Transportation Needs: No Transportation Needs  . Lack of Transportation (Medical): No  . Lack of Transportation (Non-Medical): No  Physical Activity: Inactive  . Days of Exercise per Week: 0 days  . Minutes of  Exercise per Session: 0 min  Stress: No Stress Concern Present  . Feeling of Stress : Not at all  Social Connections: Not on file  Intimate Partner Violence: Not At Risk  . Fear of Current or Ex-Partner: No  . Emotionally Abused: No  . Physically Abused: No  . Sexually Abused: No    History reviewed. No pertinent surgical history.  History reviewed. No pertinent family history.  No Known  Allergies  Current Outpatient Medications on File Prior to Visit  Medication Sig Dispense Refill  . alendronate (FOSAMAX) 70 MG tablet TAKE 1 TABLET BY MOUTH WEEKLY ON AN EMPTY STOMACH WITH WATER ONLY. NO FOOD FOR 30 MINUTES AND DO NOT LAY DOWN FOR ONE HOURS AFTER 12 tablet 3  . FLUoxetine (PROZAC) 20 MG capsule TAKE 1 CAPSULE(20 MG) BY MOUTH DAILY 30 capsule 0   No current facility-administered medications on file prior to visit.    BP 124/62   Pulse 75   Temp 98.6 F (37 C) (Temporal)   Ht 5\' 3"  (1.6 m)   Wt 110 lb (49.9 kg)   SpO2 99%   BMI 19.49 kg/m    Objective:   Physical Exam Constitutional:      Appearance: She is well-nourished.  HENT:     Right Ear: Tympanic membrane and ear canal normal.     Left Ear: Tympanic membrane and ear canal normal.     Mouth/Throat:     Mouth: Oropharynx is clear and moist.  Eyes:     Extraocular Movements: EOM normal.     Pupils: Pupils are equal, round, and reactive to light.  Cardiovascular:     Rate and Rhythm: Normal rate and regular rhythm.  Pulmonary:     Effort: Pulmonary effort is normal.     Breath sounds: Normal breath sounds.  Abdominal:     General: Bowel sounds are normal.     Palpations: Abdomen is soft.     Tenderness: There is no abdominal tenderness.  Musculoskeletal:        General: Normal range of motion.     Cervical back: Neck supple.     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 Decrease in ROM with both hands bilaterally.  Early trigger finger noted to left 4th digit  Skin:    General: Skin is warm and dry.  Neurological:     Mental Status: She is alert and oriented to person, place, and time.     Cranial Nerves: No cranial nerve deficit.     Deep Tendon Reflexes:     Reflex Scores:      Patellar reflexes are 2+ on the right side and 2+ on the left side. Psychiatric:        Mood and Affect: Mood and affect and mood normal.            Assessment & Plan:

## 2021-02-09 NOTE — Assessment & Plan Note (Addendum)
Chronic, overall improved since she's back at work.  She has no concerns today, continue fluoxetine 20 mg daily.

## 2021-02-09 NOTE — Patient Instructions (Signed)
You will be contacted regarding your referral to GI for the colonoscopy.  Please let us know if you have not been contacted within two weeks.   Stop by the lab and xray prior to leaving today. I will notify you of your results once received.   Try taking Tylenol Arthritis 500 mg every 8 hours as needed for joint pain.  Increase fiber in your diet to help with diarrhea and constipation.   Call the [facility] to schedule your mammogram and bone density scan.   It was a pleasure to see you today!

## 2021-02-10 LAB — CYCLIC CITRUL PEPTIDE ANTIBODY, IGG: Cyclic Citrullin Peptide Ab: <16 UNITS

## 2021-02-10 LAB — RHEUMATOID FACTOR: Rheumatoid fact SerPl-aCnc: <14 IU/mL (ref <14)

## 2021-02-18 ENCOUNTER — Encounter: Payer: Self-pay | Admitting: *Deleted

## 2021-08-26 DIAGNOSIS — J309 Allergic rhinitis, unspecified: Secondary | ICD-10-CM | POA: Diagnosis not present

## 2021-08-26 DIAGNOSIS — G629 Polyneuropathy, unspecified: Secondary | ICD-10-CM | POA: Diagnosis not present

## 2021-08-26 DIAGNOSIS — F329 Major depressive disorder, single episode, unspecified: Secondary | ICD-10-CM | POA: Diagnosis not present

## 2021-08-26 DIAGNOSIS — Z7983 Long term (current) use of bisphosphonates: Secondary | ICD-10-CM | POA: Diagnosis not present

## 2021-08-26 DIAGNOSIS — G8929 Other chronic pain: Secondary | ICD-10-CM | POA: Diagnosis not present

## 2021-08-26 DIAGNOSIS — M1990 Unspecified osteoarthritis, unspecified site: Secondary | ICD-10-CM | POA: Diagnosis not present

## 2021-08-26 DIAGNOSIS — Z8249 Family history of ischemic heart disease and other diseases of the circulatory system: Secondary | ICD-10-CM | POA: Diagnosis not present

## 2021-08-26 DIAGNOSIS — M810 Age-related osteoporosis without current pathological fracture: Secondary | ICD-10-CM | POA: Diagnosis not present

## 2021-08-26 DIAGNOSIS — Z87891 Personal history of nicotine dependence: Secondary | ICD-10-CM | POA: Diagnosis not present

## 2021-08-26 DIAGNOSIS — R69 Illness, unspecified: Secondary | ICD-10-CM | POA: Diagnosis not present

## 2021-08-26 DIAGNOSIS — F419 Anxiety disorder, unspecified: Secondary | ICD-10-CM | POA: Diagnosis not present

## 2021-08-26 DIAGNOSIS — Z833 Family history of diabetes mellitus: Secondary | ICD-10-CM | POA: Diagnosis not present

## 2021-08-26 DIAGNOSIS — Z7722 Contact with and (suspected) exposure to environmental tobacco smoke (acute) (chronic): Secondary | ICD-10-CM | POA: Diagnosis not present

## 2022-01-27 ENCOUNTER — Other Ambulatory Visit: Payer: Self-pay | Admitting: Primary Care

## 2022-01-27 DIAGNOSIS — M810 Age-related osteoporosis without current pathological fracture: Secondary | ICD-10-CM

## 2022-01-27 NOTE — Telephone Encounter (Signed)
Due for follow up visit this month, has not been seen since February 2022. It looks like she may have established care elsewhere in September 2022, is this the case?  If so then please decline refill request and remove myself as PCP.  If not then have her scheduled for CPE/follow up.

## 2022-01-30 NOTE — Telephone Encounter (Signed)
Called patient she does not have new pcp I have made appointment for 2/28

## 2022-02-03 NOTE — Progress Notes (Unsigned)
Subjective:   Ann Cordova is a 71 y.o. female who presents for Medicare Annual (Subsequent) preventive examination.  I connected with Ann Cordova  today by telephone and verified that I am speaking with the correct person using two identifiers. Location patient: home Location provider: work Persons participating in the virtual visit: patient, Marine scientist.    I discussed the limitations, risks, security and privacy concerns of performing an evaluation and management service by telephone and the availability of in person appointments. I also discussed with the patient that there may be a patient responsible charge related to this service. The patient expressed understanding and verbally consented to this telephonic visit.    Interactive audio and video telecommunications were attempted between this provider and patient, however failed, due to patient having technical difficulties OR patient did not have access to video capability.  We continued and completed visit with audio only.  Some vital signs may be absent or patient reported.   Time Spent with patient on telephone encounter: *** minutes  Review of Systems           Objective:    There were no vitals filed for this visit. There is no height or weight on file to calculate BMI.  Advanced Directives 02/04/2021 12/02/2018  Does Patient Have a Medical Advance Directive? No No  Would patient like information on creating a medical advance directive? No - Patient declined Yes (MAU/Ambulatory/Procedural Areas - Information given)    Current Medications (verified) Outpatient Encounter Medications as of 02/06/2022  Medication Sig   alendronate (FOSAMAX) 70 MG tablet TAKE 1 TABLET BY MOUTH WEEKLY ON AN EMPTY STOMACH WITH WATER ONLY. NO FOOD FOR 30 MINUTES AND DO NOT LAY DOWN FOR ONE HOURS AFTER   FLUoxetine (PROZAC) 20 MG capsule TAKE 1 CAPSULE(20 MG) BY MOUTH DAILY for depression.   No facility-administered encounter medications on file  as of 02/06/2022.    Allergies (verified) Patient has no known allergies.   History: No past medical history on file. No past surgical history on file. No family history on file. Social History   Socioeconomic History   Marital status: Married    Spouse name: Not on file   Number of children: Not on file   Years of education: Not on file   Highest education level: Not on file  Occupational History   Not on file  Tobacco Use   Smoking status: Former   Smokeless tobacco: Never  Vaping Use   Vaping Use: Never used  Substance and Sexual Activity   Alcohol use: No   Drug use: Not Currently   Sexual activity: Not on file  Other Topics Concern   Not on file  Social History Narrative   Married.   2 children, 6 grand children.   Retired. 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    Social Determinants of Health   Financial Resource Strain: Low Risk    Difficulty of Paying Living Expenses: Not hard at all  Food Insecurity: No Food Insecurity   Worried About Charity fundraiser in the Last Year: Never true   [place] in the Last Year: Never true  Transportation Needs: No Transportation Needs   Lack of Transportation (Medical): No   Lack of Transportation (Non-Medical): No  Physical Activity: Inactive   Days of Exercise per Week: 0 days   Minutes of Exercise per Session: 0 min  Stress: No Stress Concern Present   Feeling of  Stress : Not at all  Social Connections: Not on file    Tobacco Counseling Counseling given: Not Answered   Clinical Intake:                 Diabetic? No         Activities of Daily Living In your present state of health, do you have any difficulty performing the following activities: 02/04/2021  Hearing? N  Vision? Y  Comment Patient says her eye is not good. Has seen her eye doctor  Difficulty concentrating or making decisions? N  Walking or climbing stairs? N  Dressing or bathing? N  Doing errands,  shopping? N  Preparing Food and eating ? N  Using the Toilet? N  In the past six months, have you accidently leaked urine? Y  Comment wears pads  Do you have problems with loss of bowel control? Y  Comment wears pads  Managing your Medications? N  Managing your Finances? N  Housekeeping or managing your Housekeeping? N  Some recent data might be hidden    Patient Care Team: Pleas Koch, NP as PCP - General (Internal Medicine)  Indicate any recent Medical Services you may have received from other than [facility] providers in the past year (date may be approximate).     Assessment:   This is a routine wellness examination for Ann Cordova.  Hearing/Vision screen No results found.  Dietary issues and exercise activities discussed:     Goals Addressed   None    Depression Screen PHQ 2/9 Scores 02/04/2021 01/09/2019 12/06/2018 12/02/2018 10/10/2017  PHQ - 2 Score 0 1 4 0 0  PHQ- 9 Score 0 10 12 0 -    Fall Risk Fall Risk  02/09/2021 02/04/2021 12/02/2018 10/10/2017  Falls in the past year? 0 0 0 No  Number falls in past yr: 0 0 - -  Injury with Fall? 0 0 - -  Risk for fall due to : - No Fall Risks - -  Follow up - Falls evaluation completed;Falls prevention discussed - -    FALL RISK PREVENTION PERTAINING TO THE HOME:  Any stairs in or around the home? {YES/NO:21197} If so, are there any without handrails? {YES/NO:21197} Home free of loose throw rugs in walkways, pet beds, electrical cords, etc? {YES/NO:21197} Adequate lighting in your home to reduce risk of falls? {YES/NO:21197}  ASSISTIVE DEVICES UTILIZED TO PREVENT FALLS:  Life alert? {YES/NO:21197} Use of a cane, walker or w/c? {YES/NO:21197} Grab bars in the bathroom? {YES/NO:21197} Shower chair or bench in shower? {YES/NO:21197} Elevated toilet seat or a handicapped toilet? {YES/NO:21197}  TIMED UP AND GO:  Was the test performed? No .    Cognitive Function: MMSE - Mini Mental State Exam 02/04/2021 12/02/2018   Orientation to time 5 5  Orientation to Place 5 5  Registration 3 3  Attention/ Calculation 5 0  Recall 3 3  Language- name 2 objects - 0  Language- repeat 1 1  Language- follow 3 step command - 3  Language- read & follow direction - 0  Write a sentence - 0  Copy design - 0  Total score - 20        Immunizations Immunization History  Administered Date(s) Administered   Influenza,inj,Quad PF,6+ Mos 10/10/2017, 12/02/2018   Pneumococcal Conjugate-13 10/10/2017   Pneumococcal Polysaccharide-23 12/02/2018    TDAP status: Due, Education has been provided regarding the importance of this vaccine. Advised may receive this vaccine at local pharmacy or Health Dept. Aware to  provide a copy of the vaccination record if obtained from local pharmacy or Health Dept. Verbalized acceptance and understanding.  {Flu Vaccine status:2101806}  Pneumococcal vaccine status: Up to date  {Covid-19 vaccine status:2101808}  Qualifies for Shingles Vaccine? Yes   Zostavax completed No   {Shingrix Completed?:2101804}  Screening Tests Health Maintenance  Topic Date Due   Zoster Vaccines- Shingrix (1 of 2) Never done   INFLUENZA VACCINE  07/25/2021   MAMMOGRAM  02/04/2022 (Originally 02/04/2021)   COLONOSCOPY (Pts 45-49yrs Insurance coverage will need to be confirmed)  02/04/2022 (Originally 03/02/1997)   COVID-19 Vaccine (1) 02/20/2023 (Originally 09/02/1951)   TETANUS/TDAP  02/05/2024 (Originally 03/03/1971)   Pneumonia Vaccine 38+ Years old  Completed   DEXA SCAN  Completed   Hepatitis C Screening  Completed   HPV VACCINES  Aged Out    Health Maintenance  Health Maintenance Due  Topic Date Due   Zoster Vaccines- Shingrix (1 of 2) Never done   INFLUENZA VACCINE  07/25/2021    {Colorectal cancer screening:2101809}  Mammogram status: Ordered 02/09/21. Pt provided with contact info and advised to call to schedule appt.   Bone Density status: Ordered 02/09/21. Pt provided with contact info and  advised to call to schedule appt.  Lung Cancer Screening: (Low Dose CT Chest recommended if Age 8-80 years, 30 pack-year currently smoking OR have quit w/in 15years.) does not qualify.     Additional Screening:  Hepatitis C Screening: does qualify; Completed 12/02/18  Vision Screening: Recommended annual ophthalmology exams for early detection of glaucoma and other disorders of the eye. Is the patient up to date with their annual eye exam?  {YES/NO:21197} Who is the provider or what is the name of the office in which the patient attends annual eye exams? *** If pt is not established with a provider, would they like to be referred to a provider to establish care? {YES/NO:21197}.   Dental Screening: Recommended annual dental exams for proper oral hygiene  Community Resource Referral / Chronic Care Management: CRR required this visit?  {YES/NO:21197}  CCM required this visit?  {YES/NO:21197}     Plan:     I have personally reviewed and noted the following in the patients chart:   Medical and social history Use of alcohol, tobacco or illicit drugs  Current medications and supplements including opioid prescriptions.  Functional ability and status Nutritional status Physical activity Advanced directives List of other physicians Hospitalizations, surgeries, and ER visits in previous 12 months Vitals Screenings to include cognitive, depression, and falls Referrals and appointments  In addition, I have reviewed and discussed with patient certain preventive protocols, quality metrics, and best practice recommendations. A written personalized care plan for preventive services as well as general preventive health recommendations were provided to patient.   Due to this being a telephonic visit, the after visit summary with patients personalized plan was offered to patient via mail or my-chart. ***Patient declined at this time./ Patient would like to access on my-chart/ per request,  patient was mailed a copy of AVS./ Patient preferred to pick up at office at next visit.   Loma Messing, LPN   [id number]    Nurse Health Advisor  Nurse Notes: none

## 2022-02-06 ENCOUNTER — Ambulatory Visit: Payer: Medicare HMO

## 2022-02-06 NOTE — Progress Notes (Signed)
Subjective:   Ann Cordova is a 71 y.o. female who presents for Medicare Annual (Subsequent) preventive examination.  I connected with Zahira Brummond today by telephone and verified that I am speaking with the correct person using two identifiers. Location patient: home Location provider: work Persons participating in the virtual visit: patient, Marine scientist.    I discussed the limitations, risks, security and privacy concerns of performing an evaluation and management service by telephone and the availability of in person appointments. I also discussed with the patient that there may be a patient responsible charge related to this service. The patient expressed understanding and verbally consented to this telephonic visit.    Interactive audio and video telecommunications were attempted between this provider and patient, however failed, due to patient having technical difficulties OR patient did not have access to video capability.  We continued and completed visit with audio only.  Some vital signs may be absent or patient reported.   Time Spent with patient on telephone encounter: 20 minutes  Review of Systems     Cardiac Risk Factors include: advanced age (>25men, >18 women)     Objective:    Today's Vitals   02/07/22 0857  Weight: 110 lb (49.9 kg)  Height: 5\' 3"  (1.6 m)   Body mass index is 19.49 kg/m.  Advanced Directives 02/07/2022 02/04/2021 12/02/2018  Does Patient Have a Medical Advance Directive? No No No  Would patient like information on creating a medical advance directive? Yes (MAU/Ambulatory/Procedural Areas - Information given) No - Patient declined Yes (MAU/Ambulatory/Procedural Areas - Information given)    Current Medications (verified) Outpatient Encounter Medications as of 02/07/2022  Medication Sig   alendronate (FOSAMAX) 70 MG tablet TAKE 1 TABLET BY MOUTH WEEKLY ON AN EMPTY STOMACH WITH WATER ONLY. NO FOOD FOR 30 MINUTES AND DO NOT LAY DOWN FOR ONE HOURS  AFTER   FLUoxetine (PROZAC) 20 MG capsule TAKE 1 CAPSULE(20 MG) BY MOUTH DAILY for depression.   No facility-administered encounter medications on file as of 02/07/2022.    Allergies (verified) Patient has no known allergies.   History: History reviewed. No pertinent past medical history. History reviewed. No pertinent surgical history. History reviewed. No pertinent family history. Social History   Socioeconomic History   Marital status: Married    Spouse name: Not on file   Number of children: Not on file   Years of education: Not on file   Highest education level: Not on file  Occupational History   Not on file  Tobacco Use   Smoking status: Former   Smokeless tobacco: Never  Vaping Use   Vaping Use: Never used  Substance and Sexual Activity   Alcohol use: No   Drug use: Not Currently   Sexual activity: Not on file  Other Topics Concern   Not on file  Social History Narrative   Married.   2 children, 6 grand children.   Retired. 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    Social Determinants of Health   Financial Resource Strain: Low Risk    Difficulty of Paying Living Expenses: Not hard at all  Food Insecurity: No Food Insecurity   Worried About Charity fundraiser in the Last Year: Never true   [place] in the Last Year: Never true  Transportation Needs: No Transportation Needs   Lack of Transportation (Medical): No   Lack of Transportation (Non-Medical): No  Physical Activity: Sufficiently Active   Days of  Exercise per Week: 5 days   Minutes of Exercise per Session: 150+ min  Stress: No Stress Concern Present   Feeling of Stress : Only a little  Social Connections: Moderately Integrated   Frequency of Communication with Friends and Family: More than three times a week   Frequency of Social Gatherings with Friends and Family: More than three times a week   Attends Religious Services: More than 4 times per year   Active Member of  [employer] or Organizations: No   Attends Music therapist: Never   Marital Status: Married    Tobacco Counseling Counseling given: Not Answered   Clinical Intake:  Pre-visit preparation completed: Yes  Pain : No/denies pain     BMI - recorded: 19.49 Nutritional Status: BMI of 19-24  Normal Nutritional Risks: None Diabetes: No  How often do you need to have someone help you when you read instructions, pamphlets, or other written materials from your doctor or pharmacy?: 1 - Never  Diabetic? No  Interpreter Needed?: No  Information entered by :: Orrin Brigham LPN   Activities of Daily Living In your present state of health, do you have any difficulty performing the following activities: 02/07/2022  Hearing? N  Vision? Y  Difficulty concentrating or making decisions? N  Walking or climbing stairs? N  Dressing or bathing? N  Doing errands, shopping? N  Preparing Food and eating ? N  Using the Toilet? N  In the past six months, have you accidently leaked urine? N  Do you have problems with loss of bowel control? N  Managing your Medications? N  Managing your Finances? N  Housekeeping or managing your Housekeeping? N  Some recent data might be hidden    Patient Care Team: Pleas Koch, NP as PCP - General (Internal Medicine)  Indicate any recent Medical Services you may have received from other than [facility] providers in the past year (date may be approximate).     Assessment:   This is a routine wellness examination for Braley.  Hearing/Vision screen Hearing Screening - Comments:: No issues Vision Screening - Comments:: Last exam over a year ago, plans to make an appointment, wears glasses  Dietary issues and exercise activities discussed: Current Exercise Habits: The patient has a physically strenuous job, but has no regular exercise apart from work. (walks at work)   Goals Addressed             This Visit's Progress    Patient Stated        Would like to drink more water.       Depression Screen PHQ 2/9 Scores 02/07/2022 02/04/2021 01/09/2019 12/06/2018 12/02/2018 10/10/2017  PHQ - 2 Score 0 0 1 4 0 0  PHQ- 9 Score - 0 10 12 0 -    Fall Risk Fall Risk  02/07/2022 02/09/2021 02/04/2021 12/02/2018 10/10/2017  Falls in the past year? 1 0 0 0 No  Comment tripped - - - -  Number falls in past yr: 0 0 0 - -  Injury with Fall? 0 0 0 - -  Risk for fall due to : No Fall Risks - No Fall Risks - -  Follow up Falls prevention discussed - Falls evaluation completed;Falls prevention discussed - -    FALL RISK PREVENTION PERTAINING TO THE HOME:  Any stairs in or around the home? No  If so, are there any without handrails? No  Home free of loose throw rugs in walkways, pet beds, electrical cords,  etc? Yes  Adequate lighting in your home to reduce risk of falls? Yes   ASSISTIVE DEVICES UTILIZED TO PREVENT FALLS:  Life alert? No  Use of a cane, walker or w/c? No  Grab bars in the bathroom? Yes  Shower chair or bench in shower? Yes  Elevated toilet seat or a handicapped toilet? Yes   TIMED UP AND GO:  Was the test performed? No .    Cognitive Function: Normal cognitive status assessed by this Nurse Health Advisor. No abnormalities found.   MMSE - Mini Mental State Exam 02/04/2021 12/02/2018  Orientation to time 5 5  Orientation to Place 5 5  Registration 3 3  Attention/ Calculation 5 0  Recall 3 3  Language- name 2 objects - 0  Language- repeat 1 1  Language- follow 3 step command - 3  Language- read & follow direction - 0  Write a sentence - 0  Copy design - 0  Total score - 20        Immunizations Immunization History  Administered Date(s) Administered   Influenza,inj,Quad PF,6+ Mos 10/10/2017, 12/02/2018   Pneumococcal Conjugate-13 10/10/2017   Pneumococcal Polysaccharide-23 12/02/2018    TDAP status: Due, Education has been provided regarding the importance of this vaccine. Advised may receive this vaccine  at local pharmacy or Health Dept. Aware to provide a copy of the vaccination record if obtained from local pharmacy or Health Dept. Verbalized acceptance and understanding.  Flu Vaccine status: Declined, Education has been provided regarding the importance of this vaccine but patient still declined. Advised may receive this vaccine at local pharmacy or Health Dept. Aware to provide a copy of the vaccination record if obtained from local pharmacy or Health Dept. Verbalized acceptance and understanding.  Pneumococcal vaccine status: Up to date  Covid-19 vaccine status: Declined, Education has been provided regarding the importance of this vaccine but patient still declined. Advised may receive this vaccine at local pharmacy or Health Dept.or vaccine clinic. Aware to provide a copy of the vaccination record if obtained from local pharmacy or Health Dept. Verbalized acceptance and understanding.  Qualifies for Shingles Vaccine? Yes   Zostavax completed No   Shingrix Completed?: No.    Education has been provided regarding the importance of this vaccine. Patient has been advised to call insurance company to determine out of pocket expense if they have not yet received this vaccine. Advised may also receive vaccine at local pharmacy or Health Dept. Verbalized acceptance and understanding.  Screening Tests Health Maintenance  Topic Date Due   COLONOSCOPY (Pts 45-30yrs Insurance coverage will need to be confirmed)  Never done   Zoster Vaccines- Shingrix (1 of 2) Never done   MAMMOGRAM  02/04/2021   INFLUENZA VACCINE  07/25/2021   COVID-19 Vaccine (1) 02/20/2023 (Originally 09/02/1951)   TETANUS/TDAP  02/05/2024 (Originally 03/03/1971)   Pneumonia Vaccine 27+ Years old  Completed   DEXA SCAN  Completed   Hepatitis C Screening  Completed   HPV VACCINES  Aged Out    Health Maintenance  Health Maintenance Due  Topic Date Due   COLONOSCOPY (Pts 45-106yrs Insurance coverage will need to be confirmed)   Never done   Zoster Vaccines- Shingrix (1 of 2) Never done   MAMMOGRAM  02/04/2021   INFLUENZA VACCINE  07/25/2021    Colorectal Cancer screening: Patient plans on discussing with PCP when decided   Mammogram status: Ordered 02/07/22. Pt provided with contact info and advised to call to schedule appt.   Bone Density  status: Ordered 02/07/22. Pt provided with contact info and advised to call to schedule appt.  Lung Cancer Screening: (Low Dose CT Chest recommended if Age 49-80 years, 30 pack-year currently smoking OR have quit w/in 15years.) does not qualify.     Additional Screening:  Hepatitis C Screening: does qualify; Completed 12/02/18  Vision Screening: Recommended annual ophthalmology exams for early detection of glaucoma and other disorders of the eye. Is the patient up to date with their annual eye exam?  No  Who is the provider or what is the name of the office in which the patient attends annual eye exams? Provider information unavailable    Dental Screening: Recommended annual dental exams for proper oral hygiene  Community Resource Referral / Chronic Care Management: CRR required this visit?  No   CCM required this visit?  No      Plan:     I have personally reviewed and noted the following in the patients chart:   Medical and social history Use of alcohol, tobacco or illicit drugs  Current medications and supplements including opioid prescriptions.  Functional ability and status Nutritional status Physical activity Advanced directives List of other physicians Hospitalizations, surgeries, and ER visits in previous 12 months Vitals Screenings to include cognitive, depression, and falls Referrals and appointments  In addition, I have reviewed and discussed with patient certain preventive protocols, quality metrics, and best practice recommendations. A written personalized care plan for preventive services as well as general preventive health recommendations  were provided to patient.   Due to this being a telephonic visit, the after visit summary with patients personalized plan was offered to patient via mail or my-chart. Patient would like to access on my-chart.    Loma Messing, LPN   [id number]   Nurse Health Advisor  Nurse Notes: none

## 2022-02-07 ENCOUNTER — Ambulatory Visit (INDEPENDENT_AMBULATORY_CARE_PROVIDER_SITE_OTHER): Payer: Medicare HMO

## 2022-02-07 VITALS — Ht 63.0 in | Wt 110.0 lb

## 2022-02-07 DIAGNOSIS — Z0000 Encounter for general adult medical examination without abnormal findings: Secondary | ICD-10-CM

## 2022-02-07 DIAGNOSIS — Z1231 Encounter for screening mammogram for malignant neoplasm of breast: Secondary | ICD-10-CM

## 2022-02-07 DIAGNOSIS — Z780 Asymptomatic menopausal state: Secondary | ICD-10-CM

## 2022-02-07 NOTE — Patient Instructions (Signed)
[name] , Thank you for taking time to complete your Medicare Wellness Visit. I appreciate your ongoing commitment to your health goals. Please review the following plan we discussed and let me know if I can assist you in the future.   Screening recommendations/referrals: Colonoscopy: due, per our conversation you plan on discussing with PCP Mammogram: due, last completed 02/04/19, ordered today someone will call to schedule an appointment Bone Density: due, last completed 02/04/19, ordered today someone will call to schedule an appointment Recommended yearly ophthalmology/optometry visit for glaucoma screening and checkup Recommended yearly dental visit for hygiene and checkup  Vaccinations: Influenza vaccine: Declined today, please call office or local pharmacy to schedule if you change your mind Pneumococcal vaccine: up to date Tdap vaccine: Declined today, please call pharmacy to schedule if you change your mind Shingles vaccine: Declined today, please call your local pharmacy to schedule if you change your mind   Covid-19: newest booster available at your local pharmacy   Advanced directives: Please bring a copy of Living Will and/or [place] for your chart, when available   Conditions/risks identified: see problem list   Next appointment: Follow up in one year for your annual wellness visit 02/08/23 @ 9:00am, this will be a telephone visit    Preventive Care 65 Years and Older, Female Preventive care refers to lifestyle choices and visits with your health care provider that can promote health and wellness. What does preventive care include? A yearly physical exam. This is also called an annual well check. Dental exams once or twice a year. Routine eye exams. Ask your health care provider how often you should have your eyes checked. Personal lifestyle choices, including: Daily care of your teeth and gums. Regular physical activity. Eating a healthy  diet. Avoiding tobacco and drug use. Limiting alcohol use. Practicing safe sex. Taking low-dose aspirin every day. Taking vitamin and mineral supplements as recommended by your health care provider. What happens during an annual well check? The services and screenings done by your health care provider during your annual well check will depend on your age, overall health, lifestyle risk factors, and family history of disease. Counseling  Your health care provider may ask you questions about your: Alcohol use. Tobacco use. Drug use. Emotional well-being. Home and relationship well-being. Sexual activity. Eating habits. History of falls. Memory and ability to understand (cognition). Work and work Statistician. Reproductive health. Screening  You may have the following tests or measurements: Height, weight, and BMI. Blood pressure. Lipid and cholesterol levels. These may be checked every 5 years, or more frequently if you are over 80 years old. Skin check. Lung cancer screening. You may have this screening every year starting at age 23 if you have a 30-pack-year history of smoking and currently smoke or have quit within the past 15 years. Fecal occult blood test (FOBT) of the stool. You may have this test every year starting at age 26. Flexible sigmoidoscopy or colonoscopy. You may have a sigmoidoscopy every 5 years or a colonoscopy every 10 years starting at age 28. Hepatitis C blood test. Hepatitis B blood test. Sexually transmitted disease (STD) testing. Diabetes screening. This is done by checking your blood sugar (glucose) after you have not eaten for a while (fasting). You may have this done every 1-3 years. Bone density scan. This is done to screen for osteoporosis. You may have this done starting at age 97. Mammogram. This may be done every 1-2 years. Talk to your health care  provider about how often you should have regular mammograms. Talk with your health care provider about  your test results, treatment options, and if necessary, the need for more tests. Vaccines  Your health care provider may recommend certain vaccines, such as: Influenza vaccine. This is recommended every year. Tetanus, diphtheria, and acellular pertussis (Tdap, Td) vaccine. You may need a Td booster every 10 years. Zoster vaccine. You may need this after age 36. Pneumococcal 13-valent conjugate (PCV13) vaccine. One dose is recommended after age 47. Pneumococcal polysaccharide (PPSV23) vaccine. One dose is recommended after age 18. Talk to your health care provider about which screenings and vaccines you need and how often you need them. This information is not intended to replace advice given to you by your health care provider. Make sure you discuss any questions you have with your health care provider. Document Released: 01/07/2016 Document Revised: 08/30/2016 Document Reviewed: 10/12/2015 Elsevier Interactive Patient Education  2017 [place] Prevention in the Home Falls can cause injuries. They can happen to people of all ages. There are many things you can do to make your home safe and to help prevent falls. What can I do on the outside of my home? Regularly fix the edges of walkways and driveways and fix any cracks. Remove anything that might make you trip as you walk through a door, such as a raised step or threshold. Trim any bushes or trees on the path to your home. Use bright outdoor lighting. Clear any walking paths of anything that might make someone trip, such as rocks or tools. Regularly check to see if handrails are loose or broken. Make sure that both sides of any steps have handrails. Any raised decks and porches should have guardrails on the edges. Have any leaves, snow, or ice cleared regularly. Use sand or salt on walking paths during winter. Clean up any spills in your garage right away. This includes oil or grease spills. What can I do in the bathroom? Use  night lights. Install grab bars by the toilet and in the tub and shower. Do not use towel bars as grab bars. Use non-skid mats or decals in the tub or shower. If you need to sit down in the shower, use a plastic, non-slip stool. Keep the floor dry. Clean up any water that spills on the floor as soon as it happens. Remove soap buildup in the tub or shower regularly. Attach bath mats securely with double-sided non-slip rug tape. Do not have throw rugs and other things on the floor that can make you trip. What can I do in the bedroom? Use night lights. Make sure that you have a light by your bed that is easy to reach. Do not use any sheets or blankets that are too big for your bed. They should not hang down onto the floor. Have a firm chair that has side arms. You can use this for support while you get dressed. Do not have throw rugs and other things on the floor that can make you trip. What can I do in the kitchen? Clean up any spills right away. Avoid walking on wet floors. Keep items that you use a lot in easy-to-reach places. If you need to reach something above you, use a strong step stool that has a grab bar. Keep electrical cords out of the way. Do not use floor polish or wax that makes floors slippery. If you must use wax, use non-skid floor wax. Do not have throw rugs  and other things on the floor that can make you trip. What can I do with my stairs? Do not leave any items on the stairs. Make sure that there are handrails on both sides of the stairs and use them. Fix handrails that are broken or loose. Make sure that handrails are as long as the stairways. Check any carpeting to make sure that it is firmly attached to the stairs. Fix any carpet that is loose or worn. Avoid having throw rugs at the top or bottom of the stairs. If you do have throw rugs, attach them to the floor with carpet tape. Make sure that you have a light switch at the top of the stairs and the bottom of the  stairs. If you do not have them, ask someone to add them for you. What else can I do to help prevent falls? Wear shoes that: Do not have high heels. Have rubber bottoms. Are comfortable and fit you well. Are closed at the toe. Do not wear sandals. If you use a stepladder: Make sure that it is fully opened. Do not climb a closed stepladder. Make sure that both sides of the stepladder are locked into place. Ask someone to hold it for you, if possible. Clearly mark and make sure that you can see: Any grab bars or handrails. First and last steps. Where the edge of each step is. Use tools that help you move around (mobility aids) if they are needed. These include: Canes. Walkers. Scooters. Crutches. Turn on the lights when you go into a dark area. Replace any light bulbs as soon as they burn out. Set up your furniture so you have a clear path. Avoid moving your furniture around. If any of your floors are uneven, fix them. If there are any pets around you, be aware of where they are. Review your medicines with your doctor. Some medicines can make you feel dizzy. This can increase your chance of falling. Ask your doctor what other things that you can do to help prevent falls. This information is not intended to replace advice given to you by your health care provider. Make sure you discuss any questions you have with your health care provider. Document Released: 10/07/2009 Document Revised: 05/18/2016 Document Reviewed: 01/15/2015 Elsevier Interactive Patient Education  2017 [employer].

## 2022-02-09 ENCOUNTER — Telehealth: Payer: Self-pay | Admitting: Primary Care

## 2022-02-09 DIAGNOSIS — F331 Major depressive disorder, recurrent, moderate: Secondary | ICD-10-CM

## 2022-02-09 MED ORDER — FLUOXETINE HCL 20 MG PO CAPS: ORAL_CAPSULE | ORAL | 0 refills | Status: DC

## 2022-02-09 NOTE — Telephone Encounter (Signed)
Noted, will provide a temporary refill until she can be seen.

## 2022-02-09 NOTE — Telephone Encounter (Signed)
Pt needs a refill on FLUoxetine (PROZAC) 20 MG capsule sent to [facility]

## 2022-02-09 NOTE — Addendum Note (Signed)
Addended by: Pleas Koch on: 02/09/2022 05:51 PM   Modules accepted: Orders

## 2022-02-21 ENCOUNTER — Other Ambulatory Visit: Payer: Self-pay

## 2022-02-21 ENCOUNTER — Ambulatory Visit (INDEPENDENT_AMBULATORY_CARE_PROVIDER_SITE_OTHER): Payer: Medicare HMO | Admitting: Primary Care

## 2022-02-21 ENCOUNTER — Encounter: Payer: Self-pay | Admitting: Primary Care

## 2022-02-21 VITALS — BP 140/62 | HR 74 | Temp 99.0°F | Ht 63.0 in | Wt 110.0 lb

## 2022-02-21 DIAGNOSIS — Z1211 Encounter for screening for malignant neoplasm of colon: Secondary | ICD-10-CM

## 2022-02-21 DIAGNOSIS — M810 Age-related osteoporosis without current pathological fracture: Secondary | ICD-10-CM | POA: Diagnosis not present

## 2022-02-21 DIAGNOSIS — R69 Illness, unspecified: Secondary | ICD-10-CM | POA: Diagnosis not present

## 2022-02-21 DIAGNOSIS — J309 Allergic rhinitis, unspecified: Secondary | ICD-10-CM | POA: Diagnosis not present

## 2022-02-21 DIAGNOSIS — F331 Major depressive disorder, recurrent, moderate: Secondary | ICD-10-CM

## 2022-02-21 DIAGNOSIS — E785 Hyperlipidemia, unspecified: Secondary | ICD-10-CM | POA: Diagnosis not present

## 2022-02-21 DIAGNOSIS — Z0000 Encounter for general adult medical examination without abnormal findings: Secondary | ICD-10-CM | POA: Diagnosis not present

## 2022-02-21 MED ORDER — FLUOXETINE HCL 40 MG PO CAPS: 40.0000 mg | ORAL_CAPSULE | Freq: Every day | ORAL | 3 refills | Status: DC

## 2022-02-21 MED ORDER — NA SULFATE-K SULFATE-MG SULF 17.5-3.13-1.6 GM/177ML PO SOLN
1.0000 | Freq: Once | ORAL | 0 refills | Status: AC
Start: 2022-02-21 — End: 2022-02-21

## 2022-02-21 NOTE — Assessment & Plan Note (Addendum)
Repeat bone density scan overdue. Orders have been placed for bone density scan, discussed that she will need to call to schedule.  Resume calcium and vitamin d daily. Continue alendronate 70 mg weekly. She began alendronate in 2020.

## 2022-02-21 NOTE — Progress Notes (Signed)
Gastroenterology Pre-Procedure Review  Request Date: 03/21/2022 Requesting Physician: Dr. Vicente Males  PATIENT REVIEW QUESTIONS: The patient responded to the following health history questions as indicated:    1. Are you having any GI issues? no 2. Do you have a personal history of Polyps? no 3. Do you have a family history of Colon Cancer or Polyps? yes (aunt colon cancer) 4. Diabetes Mellitus? no 5. Joint replacements in the past 12 months?no 6. Major health problems in the past 3 months?no 7. Any artificial heart valves, MVP, or defibrillator?no    MEDICATIONS & ALLERGIES:    Patient reports the following regarding taking any anticoagulation/antiplatelet therapy:   Plavix, Coumadin, Eliquis, Xarelto, Lovenox, Pradaxa, Brilinta, or Effient? no Aspirin? no  Patient confirms/reports the following medications:  Current Outpatient Medications  Medication Sig Dispense Refill   alendronate (FOSAMAX) 70 MG tablet TAKE 1 TABLET BY MOUTH WEEKLY ON AN EMPTY STOMACH WITH WATER ONLY. NO FOOD FOR 30 MINUTES AND DO NOT LAY DOWN FOR ONE HOURS AFTER 4 tablet 0   FLUoxetine (PROZAC) 40 MG capsule Take 1 capsule (40 mg total) by mouth daily. For depression 90 capsule 3   No current facility-administered medications for this visit.    Patient confirms/reports the following allergies:  No Known Allergies  No orders of the defined types were placed in this encounter.   AUTHORIZATION INFORMATION Primary Insurance: 1D#: Group #:  Secondary Insurance: 1D#: Group #:  SCHEDULE INFORMATION: Date: 03/21/2022 Time: Location:[facility]

## 2022-02-21 NOTE — Assessment & Plan Note (Signed)
Not on treatment, continue off.  Repeat lipid panel pending.

## 2022-02-21 NOTE — Progress Notes (Signed)
Subjective:    Patient ID: Ann Cordova, female    DOB: 1951/03/22, 71 y.o.   MRN: [id number]  HPI  Ann Cordova is a very pleasant 71 y.o. female who presents today for complete physical and follow up of chronic conditions.  She would also like to discuss depression and anxiety. Symptoms include fatigue, increased stress/anxiety, feeling down/depressed at times, doesn't feel like doing things she once did.   Immunizations: -Influenza: Declines -Covid-19: Has not completed -Shingles: Declines -Pneumonia: Prevnar 13 in 2018, Pneumovax 23 in 2019  Diet: [place].  Exercise: No regular exercise. Active at work.   Eye exam: Completes annually  Dental exam: Completed last year.   Mammogram: Completed in 2020 Dexa: Completed in 2020 Colonoscopy: Overdue, agrees to colonoscopy   BP Readings from Last 3 Encounters:  02/21/22 140/62  02/09/21 124/62  12/12/19 110/70      Review of Systems  Constitutional:  Negative for unexpected weight change.  HENT:  Negative for rhinorrhea.   Respiratory:  Negative for cough and shortness of breath.   Cardiovascular:  Negative for chest pain.  Gastrointestinal:  Negative for constipation and diarrhea.  Genitourinary:  Negative for difficulty urinating.  Musculoskeletal:  Positive for arthralgias. Negative for myalgias.  Skin:  Negative for rash.  Allergic/Immunologic: Positive for environmental allergies.  Neurological:  Negative for dizziness and headaches.  Psychiatric/Behavioral:  The patient is nervous/anxious.         History reviewed. No pertinent past medical history.  Social History   Socioeconomic History   Marital status: Married    Spouse name: Not on file   Number of children: Not on file   Years of education: Not on file   Highest education level: Not on file  Occupational History   Not on file  Tobacco Use   Smoking status: Former   Smokeless tobacco: Never  Vaping Use   Vaping Use: Never used   Substance and Sexual Activity   Alcohol use: No   Drug use: Not Currently   Sexual activity: Not on file  Other Topics Concern   Not on file  Social History Narrative   Married.   2 children, 6 grand children.   Retired. 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    Social Determinants of Health   Financial Resource Strain: Low Risk    Difficulty of Paying Living Expenses: Not hard at all  Food Insecurity: No Food Insecurity   Worried About Charity fundraiser in the Last Year: Never true   [place] in the Last Year: Never true  Transportation Needs: No Transportation Needs   Lack of Transportation (Medical): No   Lack of Transportation (Non-Medical): No  Physical Activity: Sufficiently Active   Days of Exercise per Week: 5 days   Minutes of Exercise per Session: 150+ min  Stress: No Stress Concern Present   Feeling of Stress : Only a little  Social Connections: Moderately Integrated   Frequency of Communication with Friends and Family: More than three times a week   Frequency of Social Gatherings with Friends and Family: More than three times a week   Attends Religious Services: More than 4 times per year   Active Member of [employer] or Organizations: No   Attends Archivist Meetings: Never   Marital Status: Married  Human resources officer Violence: Not At Risk   Fear of Current or Ex-Partner: No   Emotionally Abused: No  Physically Abused: No   Sexually Abused: No    History reviewed. No pertinent surgical history.  History reviewed. No pertinent family history.  No Known Allergies  Current Outpatient Medications on File Prior to Visit  Medication Sig Dispense Refill   alendronate (FOSAMAX) 70 MG tablet TAKE 1 TABLET BY MOUTH WEEKLY ON AN EMPTY STOMACH WITH WATER ONLY. NO FOOD FOR 30 MINUTES AND DO NOT LAY DOWN FOR ONE HOURS AFTER 4 tablet 0   FLUoxetine (PROZAC) 20 MG capsule TAKE 1 CAPSULE(20 MG) BY MOUTH DAILY for depression. Office  visit required for further refills. 30 capsule 0   No current facility-administered medications on file prior to visit.    BP 140/62    Pulse 74    Temp 99 F (37.2 C) (Oral)    Ht 5\' 3"  (1.6 m)    Wt 110 lb (49.9 kg)    SpO2 96%    BMI 19.49 kg/m  Objective:   Physical Exam HENT:     Right Ear: Tympanic membrane and ear canal normal.     Left Ear: Tympanic membrane and ear canal normal.     Nose: Nose normal.  Eyes:     Conjunctiva/sclera: Conjunctivae normal.     Pupils: Pupils are equal, round, and reactive to light.  Neck:     Thyroid: No thyromegaly.  Cardiovascular:     Rate and Rhythm: Normal rate and regular rhythm.     Heart sounds: No murmur heard. Pulmonary:     Effort: Pulmonary effort is normal.     Breath sounds: Normal breath sounds. No rales.  Abdominal:     General: Bowel sounds are normal.     Palpations: Abdomen is soft.     Tenderness: There is no abdominal tenderness.  Musculoskeletal:        General: Normal range of motion.     Cervical back: Neck supple.  Lymphadenopathy:     Cervical: No cervical adenopathy.  Skin:    General: Skin is warm and dry.     Findings: No rash.  Neurological:     Mental Status: She is alert and oriented to person, place, and time.     Cranial Nerves: No cranial nerve deficit.     Deep Tendon Reflexes: Reflexes are normal and symmetric.  Psychiatric:        Mood and Affect: Mood normal.          Assessment & Plan:      This visit occurred during the SARS-CoV-2 public health emergency.  Safety protocols were in place, including screening questions prior to the visit, additional usage of staff PPE, and extensive cleaning of exam room while observing appropriate contact time as indicated for disinfecting solutions.

## 2022-02-21 NOTE — Assessment & Plan Note (Addendum)
Deteriorated.  Increase dose of fluoxetine to 40 mg daily, new Rx sent to pharmacy.   She will update if no improvement.

## 2022-02-21 NOTE — Assessment & Plan Note (Addendum)
Chronic and continued.  Not currently on OTC treatment.   Recommended she start Zyrtec 10 mg nightly.  Continue to monitor.

## 2022-02-21 NOTE — Assessment & Plan Note (Signed)
Declines influenza and Shingrix vaccines. Other vaccines UTD.  Mammogram and bone density scan overdue, orders placed.  Colonoscopy overdue, referral placed to GI.  Encouraged a healthy diet and regular exercise.  Exam today stable Labs pending.

## 2022-02-21 NOTE — Patient Instructions (Signed)
Stop by the lab prior to leaving today. I will notify you of your results once received.   We increased the dose of your fluoxetine (Prozac) to 40 mg for depression.  [facility] and bone density scan.   You will be contacted regarding your referral to GI for the colonoscopy.  Please let us know if you have not been contacted within two weeks.   It was a pleasure to see you today!  Preventive Care 23 Years and Older, Female Preventive care refers to lifestyle choices and visits with your health care provider that can promote health and wellness. Preventive care visits are also called wellness exams. What can I expect for my preventive care visit? Counseling Your health care provider may ask you questions about your: Medical history, including: Past medical problems. Family medical history. Pregnancy and menstrual history. History of falls. Current health, including: Memory and ability to understand (cognition). Emotional well-being. Home life and relationship well-being. Sexual activity and sexual health. Lifestyle, including: Alcohol, nicotine or tobacco, and drug use. Access to firearms. Diet, exercise, and sleep habits. Work and work Statistician. Sunscreen use. Safety issues such as seatbelt and bike helmet use. Physical exam Your health care provider will check your: Height and weight. These may be used to calculate your BMI (body mass index). BMI is a measurement that tells if you are at a healthy weight. Waist circumference. This measures the distance around your waistline. This measurement also tells if you are at a healthy weight and may help predict your risk of certain diseases, such as type 2 diabetes and high blood pressure. Heart rate and blood pressure. Body temperature. Skin for abnormal spots. What immunizations do I need? Vaccines are usually given at various ages, according to a schedule. Your health care provider will  recommend vaccines for you based on your age, medical history, and lifestyle or other factors, such as travel or where you work. What tests do I need? Screening Your health care provider may recommend screening tests for certain conditions. This may include: Lipid and cholesterol levels. Hepatitis C test. Hepatitis B test. HIV (human immunodeficiency virus) test. STI (sexually transmitted infection) testing, if you are at risk. Lung cancer screening. Colorectal cancer screening. Diabetes screening. This is done by checking your blood sugar (glucose) after you have not eaten for a while (fasting). Mammogram. Talk with your health care provider about how often you should have regular mammograms. BRCA-related cancer screening. This may be done if you have a family history of breast, ovarian, tubal, or peritoneal cancers. Bone density scan. This is done to screen for osteoporosis. Talk with your health care provider about your test results, treatment options, and if necessary, the need for more tests. Follow these instructions at home: Eating and drinking  Eat a diet that includes fresh fruits and vegetables, whole grains, lean protein, and low-fat dairy products. Limit your intake of foods with high amounts of sugar, saturated fats, and salt. Take vitamin and mineral supplements as recommended by your health care provider. Do not drink alcohol if your health care provider tells you not to drink. If you drink alcohol: Limit how much you have to 0-1 drink a day. Know how much alcohol is in your drink. In the [place] one drink equals one 12 oz bottle of beer (355 mL), one 5 oz glass of wine (148 mL), or one 1 oz glass of hard liquor (44 mL). Lifestyle Brush your teeth every morning and night  with fluoride toothpaste. Floss one time each day. Exercise for at least 30 minutes 5 or more days each week. Do not use any products that contain nicotine or tobacco. These products include cigarettes,  chewing tobacco, and vaping devices, such as e-cigarettes. If you need help quitting, ask your health care provider. Do not use drugs. If you are sexually active, practice safe sex. Use a condom or other form of protection in order to prevent STIs. Take aspirin only as told by your health care provider. Make sure that you understand how much to take and what form to take. Work with your health care provider to find out whether it is safe and beneficial for you to take aspirin daily. Ask your health care provider if you need to take a cholesterol-lowering medicine (statin). Find healthy ways to manage stress, such as: Meditation, yoga, or listening to music. Journaling. Talking to a trusted person. Spending time with friends and family. Minimize exposure to UV radiation to reduce your risk of skin cancer. Safety Always wear your seat belt while driving or riding in a vehicle. Do not drive: If you have been drinking alcohol. Do not ride with someone who has been drinking. When you are tired or distracted. While texting. If you have been using any mind-altering substances or drugs. Wear a helmet and other protective equipment during sports activities. If you have firearms in your house, make sure you follow all gun safety procedures. What's next? Visit your health care provider once a year for an annual wellness visit. Ask your health care provider how often you should have your eyes and teeth checked. Stay up to date on all vaccines. This information is not intended to replace advice given to you by your health care provider. Make sure you discuss any questions you have with your health care provider. Document Revised: 06/08/2021 Document Reviewed: 06/08/2021 Elsevier Patient Education  [place].

## 2022-02-22 LAB — CBC
HCT: 37.2 % (ref 36.0–46.0)
Hemoglobin: 12.7 g/dL (ref 12.0–15.0)
MCHC: 34.2 g/dL (ref 30.0–36.0)
MCV: 89.3 fl (ref 78.0–100.0)
Platelets: 383 10*3/uL (ref 150.0–400.0)
RBC: 4.17 Mil/uL (ref 3.87–5.11)
RDW: 13.5 % (ref 11.5–15.5)
WBC: 5.9 10*3/uL (ref 4.0–10.5)

## 2022-02-22 LAB — COMPREHENSIVE METABOLIC PANEL
ALT: 10 U/L (ref 0–35)
AST: 16 U/L (ref 0–37)
Albumin: 4.4 g/dL (ref 3.5–5.2)
Alkaline Phosphatase: 75 U/L (ref 39–117)
BUN: 11 mg/dL (ref 6–23)
CO2: 27 mEq/L (ref 19–32)
Calcium: 9.5 mg/dL (ref 8.4–10.5)
Chloride: 102 mEq/L (ref 96–112)
Creatinine, Ser: 0.73 mg/dL (ref 0.40–1.20)
GFR: 83.58 mL/min (ref >60.00)
Glucose, Bld: 81 mg/dL (ref 70–99)
Potassium: 4 mEq/L (ref 3.5–5.1)
Sodium: 138 mEq/L (ref 135–145)
Total Bilirubin: 0.5 mg/dL (ref 0.2–1.2)
Total Protein: 7 g/dL (ref 6.0–8.3)

## 2022-02-22 LAB — LIPID PANEL
Cholesterol: 194 mg/dL (ref 0–200)
HDL: 52.8 mg/dL (ref >39.00)
LDL Cholesterol: 112 mg/dL — ABNORMAL HIGH (ref 0–99)
NonHDL: 141.29
Total CHOL/HDL Ratio: 4
Triglycerides: 144 mg/dL (ref 0.0–149.0)
VLDL: 28.8 mg/dL (ref 0.0–40.0)

## 2022-02-25 ENCOUNTER — Other Ambulatory Visit: Payer: Self-pay | Admitting: Primary Care

## 2022-02-25 DIAGNOSIS — M810 Age-related osteoporosis without current pathological fracture: Secondary | ICD-10-CM

## 2022-03-09 ENCOUNTER — Other Ambulatory Visit: Payer: Self-pay | Admitting: Primary Care

## 2022-03-21 ENCOUNTER — Other Ambulatory Visit: Payer: Self-pay

## 2022-03-21 ENCOUNTER — Encounter: Payer: Self-pay | Admitting: Gastroenterology

## 2022-03-21 ENCOUNTER — Ambulatory Visit: Payer: Medicare HMO | Admitting: Certified Registered Nurse Anesthetist

## 2022-03-21 ENCOUNTER — Ambulatory Visit
Admission: RE | Admit: 2022-03-21 | Discharge: 2022-03-21 | Disposition: A | Discharge status: Home / Self Care | Payer: Medicare HMO | Attending: Gastroenterology | Admitting: Gastroenterology

## 2022-03-21 ENCOUNTER — Encounter
Admission: RE | Disposition: A | Discharge status: Home / Self Care | Payer: Self-pay | Source: Home / Self Care | Attending: Gastroenterology

## 2022-03-21 DIAGNOSIS — K635 Polyp of colon: Secondary | ICD-10-CM

## 2022-03-21 DIAGNOSIS — D123 Benign neoplasm of transverse colon: Secondary | ICD-10-CM | POA: Diagnosis not present

## 2022-03-21 DIAGNOSIS — Z1211 Encounter for screening for malignant neoplasm of colon: Secondary | ICD-10-CM | POA: Diagnosis not present

## 2022-03-21 DIAGNOSIS — D124 Benign neoplasm of descending colon: Secondary | ICD-10-CM | POA: Diagnosis not present

## 2022-03-21 DIAGNOSIS — D120 Benign neoplasm of cecum: Secondary | ICD-10-CM | POA: Diagnosis not present

## 2022-03-21 HISTORY — PX: COLONOSCOPY WITH PROPOFOL: SHX5780

## 2022-03-21 SURGERY — COLONOSCOPY WITH PROPOFOL
Anesthesia: General

## 2022-03-21 MED ORDER — SODIUM CHLORIDE 0.9 % IV SOLN: INTRAVENOUS | Status: DC

## 2022-03-21 MED ORDER — SODIUM CHLORIDE (PF) 0.9 % IJ SOLN
INTRAMUSCULAR | Status: DC | PRN
  Administered 2022-03-21: 5 mL via INTRAVENOUS

## 2022-03-21 MED ORDER — PROPOFOL 10 MG/ML IV BOLUS
INTRAVENOUS | Status: DC | PRN
  Administered 2022-03-21: 40 mg via INTRAVENOUS

## 2022-03-21 MED ORDER — PROPOFOL 500 MG/50ML IV EMUL
INTRAVENOUS | Status: DC | PRN
Start: 2022-03-21 — End: 2022-03-21
  Administered 2022-03-21: 150 ug/kg/min via INTRAVENOUS

## 2022-03-21 MED ORDER — LIDOCAINE HCL (CARDIAC) PF 100 MG/5ML IV SOSY
PREFILLED_SYRINGE | INTRAVENOUS | Status: DC | PRN
  Administered 2022-03-21: 40 mg via INTRAVENOUS

## 2022-03-21 NOTE — Transfer of Care (Signed)
Immediate Anesthesia Transfer of Care Note ? ?Patient: Ann Cordova ? ?Procedure(s) Performed: COLONOSCOPY WITH PROPOFOL ? ?Patient Location: PACU ? ?Anesthesia Type:General ? ?Level of Consciousness: awake, alert  and oriented ? ?Airway & Oxygen Therapy: Patient Spontanous Breathing and Patient connected to nasal cannula oxygen ? ?Post-op Assessment: Report given to RN and Post -op Vital signs reviewed and stable ? ?Post vital signs: Reviewed and stable ? ?Last Vitals:  ?Vitals Value Taken Time  ?BP    ?Temp    ?Pulse    ?Resp    ?SpO2    ? ? ?Last Pain:  ?Vitals:  ? 03/21/22 1016  ?TempSrc: Temporal  ?PainSc: 0-No pain  ?   ? ?  ? ?Complications: No notable events documented. ?

## 2022-03-21 NOTE — Anesthesia Preprocedure Evaluation (Signed)
Anesthesia Evaluation  ?Patient identified by MRN, date of birth, ID band ?Patient awake ? ? ? ?Reviewed: ?Allergy & Precautions, NPO status , Patient's Chart, lab work & pertinent test results ? ?History of Anesthesia Complications ?Negative for: history of anesthetic complications ? ?Airway ?Mallampati: III ? ?TM Distance: >3 FB ?Neck ROM: full ? ? ? Dental ? ?(+) Chipped, Poor Dentition, Missing, Upper Dentures ?  ?Pulmonary ?neg pulmonary ROS, neg shortness of breath,  ?  ?Pulmonary exam normal ? ? ? ? ? ? ? Cardiovascular ?Exercise Tolerance: Good ?(-) angina(-) Past MI negative cardio ROS ?Normal cardiovascular exam ? ? ?  ?Neuro/Psych ?PSYCHIATRIC DISORDERS negative neurological ROS ?   ? GI/Hepatic ?negative GI ROS, Neg liver ROS, neg GERD  ,  ?Endo/Other  ?negative endocrine ROS ? Renal/GU ?negative Renal ROS  ?negative genitourinary ?  ?Musculoskeletal ? ? Abdominal ?  ?Peds ? Hematology ?negative hematology ROS ?(+)   ?Anesthesia Other Findings ?History reviewed. No pertinent past medical history. ? ?History reviewed. No pertinent surgical history. ? ?BMI   ? Body Mass Index: 18.30 kg/m?  ?  ? ? Reproductive/Obstetrics ?negative OB ROS ? ?  ? ? ? ? ? ? ? ? ? ? ? ? ? ?  ?  ? ? ? ? ? ? ? ? ?Anesthesia Physical ?Anesthesia Plan ? ?ASA: 2 ? ?Anesthesia Plan: General  ? ?Post-op Pain Management:   ? ?Induction: Intravenous ? ?PONV Risk Score and Plan: Dexamethasone, Ondansetron, Midazolam and Treatment may vary due to age or medical condition ? ?Airway Management Planned: Natural Airway and Nasal Cannula ? ?Additional Equipment:  ? ?Intra-op Plan:  ? ?Post-operative Plan:  ? ?Informed Consent: I have reviewed the patients History and Physical, chart, labs and discussed the procedure including the risks, benefits and alternatives for the proposed anesthesia with the patient or authorized representative who has indicated his/her understanding and acceptance.  ? ? ? ?Dental  Advisory Given ? ?Plan Discussed with: Anesthesiologist, CRNA and Surgeon ? ?Anesthesia Plan Comments: (Patient consented for risks of anesthesia including but not limited to:  ?- adverse reactions to medications ?- risk of airway placement if required ?- damage to eyes, teeth, lips or other oral mucosa ?- nerve damage due to positioning  ?- sore throat or hoarseness ?- Damage to heart, brain, nerves, lungs, other parts of body or loss of life ? ?Patient voiced understanding.)  ? ? ? ? ? ? ?Anesthesia Quick Evaluation ? ?

## 2022-03-21 NOTE — H&P (Signed)
? ? ? ?Jonathon Bellows, MD ?[address], [place], [place], [place], [address] ?[address] [place], [place], [place], [address] ?Phone: [phone]  ?Fax: [phone] ? ?Primary Care Physician:  Pleas Koch, NP ? ? ?Pre-Procedure History & Physical: ?HPI:  Ann Cordova is a 71 y.o. female is here for an colonoscopy. ?  ?History reviewed. No pertinent past medical history. ? ?History reviewed. No pertinent surgical history. ? ?Prior to Admission medications   ?Medication Sig Start Date End Date Taking? Authorizing Provider  ?alendronate (FOSAMAX) 70 MG tablet TAKE 1 TABLET BY MOUTH WEEKLY ON AN EMPTY STOMACH WITH WATER ONLY. NO FOOD FOR 30 MINUTES AND DO NOT LAY DOWN FOR ONE HOURS AFTER 02/26/22  Yes Pleas Koch, NP  ?FLUoxetine (PROZAC) 40 MG capsule Take 1 capsule (40 mg total) by mouth daily. For depression 02/21/22  Yes Pleas Koch, NP  ? ? ?Allergies as of 02/21/2022  ? (No Known Allergies)  ? ? ?History reviewed. No pertinent family history. ? ?Social History  ? ?Socioeconomic History  ? Marital status: Married  ?  Spouse name: Not on file  ? Number of children: Not on file  ? Years of education: Not on file  ? Highest education level: Not on file  ?Occupational History  ? Not on file  ?Tobacco Use  ? Smoking status: Former  ? Smokeless tobacco: Never  ?Vaping Use  ? Vaping Use: Never used  ?Substance and Sexual Activity  ? Alcohol use: No  ? Drug use: Not Currently  ? Sexual activity: Not on file  ?Other Topics Concern  ? Not on file  ?Social History Narrative  ? Married.  ? 2 children, 6 grand children.  ? Retired. Worked in Scientist, research (medical).  ? Takes care of her mother and husband.   ? ?Social Determinants of Health  ? ?Financial Resource Strain: Low Risk   ? Difficulty of Paying Living Expenses: Not hard at all  ?Food Insecurity: No Food Insecurity  ? Worried About Charity fundraiser in the Last Year: Never true  ? Ran Out of Food in the Last Year: Never true  ?Transportation Needs: No  Transportation Needs  ? Lack of Transportation (Medical): No  ? Lack of Transportation (Non-Medical): No  ?Physical Activity: Sufficiently Active  ? Days of Exercise per Week: 5 days  ? Minutes of Exercise per Session: 150+ min  ?Stress: No Stress Concern Present  ? Feeling of Stress : Only a little  ?Social Connections: Moderately Integrated  ? Frequency of Communication with Friends and Family: More than three times a week  ? Frequency of Social Gatherings with Friends and Family: More than three times a week  ? Attends Religious Services: More than 4 times per year  ? Active Member of Clubs or Organizations: No  ? Attends Archivist Meetings: Never  ? Marital Status: Married  ?Intimate Partner Violence: Not At Risk  ? Fear of Current or Ex-Partner: No  ? Emotionally Abused: No  ? Physically Abused: No  ? Sexually Abused: No  ? ? ?Review of Systems: ?See HPI, otherwise negative ROS ? ?Physical Exam: ?BP 125/81   Pulse 73   Temp (!) 96.7 ?F (35.9 ?C) (Temporal)   Resp 16   Ht '5\' 5"'$  (1.651 m)   Wt 49.9 kg   SpO2 97%   BMI 18.30 kg/m?  ?General:   Alert,  pleasant and cooperative in NAD ?Head:  Normocephalic and atraumatic. ?Neck:  Supple; no  masses or thyromegaly. ?Lungs:  Clear throughout to auscultation, normal respiratory effort.    ?Heart:  +S1, +S2, Regular rate and rhythm, No edema. ?Abdomen:  Soft, nontender and nondistended. Normal bowel sounds, without guarding, and without rebound.   ?Neurologic:  Alert and  oriented x4;  grossly normal neurologically. ? ?Impression/Plan: ?Ann Cordova is here for an colonoscopy to be performed for Screening colonoscopy average risk   ?Risks, benefits, limitations, and alternatives regarding  colonoscopy have been reviewed with the patient.  Questions have been answered.  All parties agreeable. ? ? ?Jonathon Bellows, MD  03/21/2022, 10:29 AM ? ?

## 2022-03-21 NOTE — Anesthesia Postprocedure Evaluation (Signed)
Anesthesia Post Note ? ?Patient: Ann Cordova ? ?Procedure(s) Performed: COLONOSCOPY WITH PROPOFOL ? ?Patient location during evaluation: Endoscopy ?Anesthesia Type: General ?Level of consciousness: awake and alert ?Pain management: pain level controlled ?Vital Signs Assessment: post-procedure vital signs reviewed and stable ?Respiratory status: spontaneous breathing, nonlabored ventilation, respiratory function stable and patient connected to nasal cannula oxygen ?Cardiovascular status: blood pressure returned to baseline and stable ?Postop Assessment: no apparent nausea or vomiting ?Anesthetic complications: no ? ? ?No notable events documented. ? ? ?Last Vitals:  ?Vitals:  ? 03/21/22 1130 03/21/22 1140  ?BP: 123/70 108/60  ?Pulse: 62 (!) 58  ?Resp: (!) 22 19  ?Temp:    ?SpO2: 100% 99%  ?  ?Last Pain:  ?Vitals:  ? 03/21/22 1110  ?TempSrc: Temporal  ?PainSc:   ? ? ?  ?  ?  ?  ?  ?  ? ?Precious Haws Danyel Tobey ? ? ? ? ?

## 2022-03-21 NOTE — Op Note (Signed)
[facility] ?Gastroenterology ?Patient Name: Ann Cordova ?Procedure Date: 03/21/2022 10:26 AM ?MRN: [id number] ?Account #: [id number] ?Date of Birth: February 08, 1951 ?Admit Type: Outpatient ?Age: 71 ?Room: [facility] ENDO ROOM 2 ?Gender: Female ?Note Status: Finalized ?Instrument Name: Peds Colonoscope [id number] ?Procedure:             Colonoscopy ?Indications:           Screening for colorectal malignant neoplasm ?Providers:             Jonathon Bellows MD, MD ?Medicines:             Monitored Anesthesia Care ?Complications:         No immediate complications. ?Procedure:             Pre-Anesthesia Assessment: ?                       - Prior to the procedure, a History and Physical was  ?                       performed, and patient medications, allergies and  ?                       sensitivities were reviewed. The patient's tolerance  ?                       of previous anesthesia was reviewed. ?                       - The risks and benefits of the procedure and the  ?                       sedation options and risks were discussed with the  ?                       patient. All questions were answered and informed  ?                       consent was obtained. ?                       - ASA Grade Assessment: II - A patient with mild  ?                       systemic disease. ?                       After obtaining informed consent, the colonoscope was  ?                       passed under direct vision. Throughout the procedure,  ?                       the patient's blood pressure, pulse, and oxygen  ?                       saturations were monitored continuously. The  ?                       Colonoscope was introduced through the anus and  ?  advanced to the the cecum, identified by the  ?                       appendiceal orifice. The colonoscopy was performed  ?                       with ease. The patient tolerated the procedure well.  ?                       The quality of the bowel  preparation was good. ?Findings: ?     The perianal and digital rectal examinations were normal. ?     Two sessile polyps were found in the descending colon. The polyps were 5  ?     to 7 mm in size. These polyps were removed with a cold snare. Resection  ?     and retrieval were complete. ?     Two sessile polyps were found in the cecum. The polyps were 4 to 6 mm in  ?     size. These polyps were removed with a cold snare. Resection and  ?     retrieval were complete. ?     A 3 mm polyp was found in the cecum. The polyp was sessile. The polyp  ?     was removed with a cold biopsy forceps. Resection and retrieval were  ?     complete. ?     Two sessile and semi-pedunculated polyps were found in the transverse  ?     colon. The polyps were 10 to 12 mm in size. Estimated blood loss was  ?     minimal. Preparations were made for mucosal resection. Saline was  ?     injected to raise the lesion. Snare mucosal resection was performed.  ?     Resection and retrieval were complete. To prevent bleeding after mucosal  ?     resection, two hemostatic clips were successfully placed. There was no  ?     bleeding during, or at the end, of the procedure. Borders of polyp  ?     marked using blue or nbi light ?     The exam was otherwise without abnormality on direct and retroflexion  ?     views. ?Impression:            - Two 5 to 7 mm polyps in the descending colon,  ?                       removed with a cold snare. Resected and retrieved. ?                       - Two 4 to 6 mm polyps in the cecum, removed with a  ?                       cold snare. Resected and retrieved. ?                       - One 3 mm polyp in the cecum, removed with a cold  ?                       biopsy forceps. Resected and retrieved. ?                       -  Two 10 to 12 mm polyps in the transverse colon,  ?                       removed with mucosal resection. Resected and  ?                       retrieved. Clips were placed. ?                        - The examination was otherwise normal on direct and  ?                       retroflexion views. ?                       - Mucosal resection was performed. Resection and  ?                       retrieval were complete. ?Recommendation:        - Discharge patient to home (with escort). ?                       - Resume previous diet. ?                       - Continue present medications. ?                       - Await pathology results. ?                       - Repeat colonoscopy in 3 years for surveillance. ?Procedure Code(s):     --- Professional --- ?                       [phone], Colonoscopy, flexible; with endoscopic mucosal  ?                       resection ?                       59292, 59, Colonoscopy, flexible; with removal of  ?                       tumor(s), polyp(s), or other lesion(s) by snare  ?                       technique ?                       45380, 59, Colonoscopy, flexible; with biopsy, single  ?                       or multiple ?Diagnosis Code(s):     --- Professional --- ?                       K63.5, Polyp of colon ?                       Z12.11, Encounter for screening for malignant neoplasm  ?  of colon ?CPT copyright 2019 American Medical Association. All rights reserved. ?The codes documented in this report are preliminary and upon coder review may  ?be revised to meet current compliance requirements. ?Jonathon Bellows, MD ?Jonathon Bellows MD, MD ?03/21/2022 11:19:36 AM ?This report has been signed electronically. ?Number of Addenda: 0 ?Note Initiated On: 03/21/2022 10:26 AM ?Scope Withdrawal Time: 0 hours 28 minutes 27 seconds  ?Total Procedure Duration: 0 hours 33 minutes 23 seconds  ?Estimated Blood Loss:  Estimated blood loss: none. ?     [facility] ?

## 2022-03-22 ENCOUNTER — Other Ambulatory Visit: Payer: Self-pay | Admitting: Primary Care

## 2022-03-22 ENCOUNTER — Encounter: Payer: Self-pay | Admitting: Gastroenterology

## 2022-03-22 DIAGNOSIS — M810 Age-related osteoporosis without current pathological fracture: Secondary | ICD-10-CM

## 2022-03-22 LAB — SURGICAL PATHOLOGY

## 2022-03-22 NOTE — Telephone Encounter (Signed)
Patient is overdue for a bone density scan. ?I see the orders placed from our health advise her nurse, has the patient called to schedule her bone density scan? ?

## 2022-03-24 NOTE — Telephone Encounter (Signed)
Called patient she is not needing refill. She has not made appointment but will cal and get scheduled.  ?

## 2022-04-10 ENCOUNTER — Emergency Department: Payer: Medicare HMO

## 2022-04-10 ENCOUNTER — Observation Stay: Payer: Medicare HMO | Admitting: Anesthesiology

## 2022-04-10 ENCOUNTER — Encounter: Payer: Self-pay | Admitting: Radiology

## 2022-04-10 ENCOUNTER — Telehealth: Payer: Self-pay

## 2022-04-10 ENCOUNTER — Other Ambulatory Visit: Payer: Self-pay

## 2022-04-10 ENCOUNTER — Encounter
Admission: EM | Disposition: A | Discharge status: Home / Self Care | Payer: Self-pay | Source: Home / Self Care | Attending: Student in an Organized Health Care Education/Training Program

## 2022-04-10 ENCOUNTER — Observation Stay
Admission: EM | Admit: 2022-04-10 | Discharge: 2022-04-11 | Disposition: A | Discharge status: Home / Self Care | Payer: Medicare HMO | Attending: General Surgery | Admitting: General Surgery

## 2022-04-10 DIAGNOSIS — Z87891 Personal history of nicotine dependence: Secondary | ICD-10-CM | POA: Insufficient documentation

## 2022-04-10 DIAGNOSIS — K36 Other appendicitis: Secondary | ICD-10-CM | POA: Diagnosis not present

## 2022-04-10 DIAGNOSIS — K3580 Unspecified acute appendicitis: Secondary | ICD-10-CM | POA: Diagnosis not present

## 2022-04-10 DIAGNOSIS — K353 Acute appendicitis with localized peritonitis, without perforation or gangrene: Principal | ICD-10-CM | POA: Diagnosis present

## 2022-04-10 DIAGNOSIS — R079 Chest pain, unspecified: Secondary | ICD-10-CM | POA: Diagnosis not present

## 2022-04-10 DIAGNOSIS — R0789 Other chest pain: Secondary | ICD-10-CM | POA: Diagnosis not present

## 2022-04-10 DIAGNOSIS — Z79899 Other long term (current) drug therapy: Secondary | ICD-10-CM | POA: Insufficient documentation

## 2022-04-10 DIAGNOSIS — R109 Unspecified abdominal pain: Secondary | ICD-10-CM | POA: Diagnosis not present

## 2022-04-10 HISTORY — DX: Acute appendicitis with localized peritonitis, without perforation or gangrene: K35.30

## 2022-04-10 HISTORY — PX: XI ROBOTIC LAPAROSCOPIC ASSISTED APPENDECTOMY: SHX6877

## 2022-04-10 LAB — CBC
HCT: 38.7 % (ref 36.0–46.0)
Hemoglobin: 12.5 g/dL (ref 12.0–15.0)
MCH: 29.8 pg (ref 26.0–34.0)
MCHC: 32.3 g/dL (ref 30.0–36.0)
MCV: 92.1 fL (ref 80.0–100.0)
Platelets: 303 10*3/uL (ref 150–400)
RBC: 4.2 MIL/uL (ref 3.87–5.11)
RDW: 13.2 % (ref 11.5–15.5)
WBC: 9.6 10*3/uL (ref 4.0–10.5)
nRBC: 0 % (ref 0.0–0.2)

## 2022-04-10 LAB — BASIC METABOLIC PANEL
Anion gap: 10 (ref 5–15)
BUN: 16 mg/dL (ref 8–23)
CO2: 25 mmol/L (ref 22–32)
Calcium: 9.3 mg/dL (ref 8.9–10.3)
Chloride: 99 mmol/L (ref 98–111)
Creatinine, Ser: 0.75 mg/dL (ref 0.44–1.00)
GFR, Estimated: >60 mL/min (ref >60)
Glucose, Bld: 120 mg/dL — ABNORMAL HIGH (ref 70–99)
Potassium: 3.4 mmol/L — ABNORMAL LOW (ref 3.5–5.1)
Sodium: 134 mmol/L — ABNORMAL LOW (ref 135–145)

## 2022-04-10 LAB — TROPONIN I (HIGH SENSITIVITY)
Troponin I (High Sensitivity): 4 ng/L (ref <18)
Troponin I (High Sensitivity): 4 ng/L (ref <18)

## 2022-04-10 SURGERY — APPENDECTOMY, ROBOT-ASSISTED, LAPAROSCOPIC
Anesthesia: General

## 2022-04-10 MED ORDER — HYDROCODONE-ACETAMINOPHEN 5-325 MG PO TABS
1.0000 | ORAL_TABLET | ORAL | Status: DC | PRN
  Administered 2022-04-11 (×2): 2 via ORAL
  Filled 2022-04-10: qty 2
  Filled 2022-04-10: qty 1
  Filled 2022-04-10: qty 2

## 2022-04-10 MED ORDER — ACETAMINOPHEN 10 MG/ML IV SOLN
INTRAVENOUS | Status: DC | PRN
  Administered 2022-04-10: 750 mg via INTRAVENOUS

## 2022-04-10 MED ORDER — ACETAMINOPHEN 10 MG/ML IV SOLN
INTRAVENOUS | Status: AC
  Filled 2022-04-10: qty 100

## 2022-04-10 MED ORDER — SODIUM CHLORIDE 0.9 % IV BOLUS
500.0000 mL | Freq: Once | INTRAVENOUS | Status: AC
Start: 2022-04-10 — End: 2022-04-10
  Administered 2022-04-10: 500 mL via INTRAVENOUS

## 2022-04-10 MED ORDER — ONDANSETRON HCL 4 MG/2ML IJ SOLN
INTRAMUSCULAR | Status: AC
  Filled 2022-04-10: qty 2

## 2022-04-10 MED ORDER — FLUOXETINE HCL 20 MG PO CAPS
40.0000 mg | ORAL_CAPSULE | Freq: Every day | ORAL | Status: DC
  Administered 2022-04-11: 40 mg via ORAL
  Filled 2022-04-10: qty 2

## 2022-04-10 MED ORDER — LACTATED RINGERS IV SOLN: INTRAVENOUS | Status: DC | PRN

## 2022-04-10 MED ORDER — ONDANSETRON 4 MG PO TBDP
4.0000 mg | ORAL_TABLET | Freq: Four times a day (QID) | ORAL | Status: DC | PRN
  Filled 2022-04-10: qty 1

## 2022-04-10 MED ORDER — MORPHINE SULFATE (PF) 4 MG/ML IV SOLN
4.0000 mg | INTRAVENOUS | Status: DC | PRN
  Administered 2022-04-11 (×2): 4 mg via INTRAVENOUS
  Filled 2022-04-10 (×2): qty 1

## 2022-04-10 MED ORDER — FENTANYL CITRATE (PF) 100 MCG/2ML IJ SOLN
INTRAMUSCULAR | Status: AC
  Administered 2022-04-10: 25 ug via INTRAVENOUS
  Filled 2022-04-10: qty 2

## 2022-04-10 MED ORDER — SUGAMMADEX SODIUM 500 MG/5ML IV SOLN
INTRAVENOUS | Status: DC | PRN
Start: 2022-04-10 — End: 2022-04-10
  Administered 2022-04-10: 120 mg via INTRAVENOUS

## 2022-04-10 MED ORDER — BUPIVACAINE-EPINEPHRINE (PF) 0.5% -1:200000 IJ SOLN
INTRAMUSCULAR | Status: AC
  Filled 2022-04-10: qty 30

## 2022-04-10 MED ORDER — SUCCINYLCHOLINE CHLORIDE 200 MG/10ML IV SOSY
PREFILLED_SYRINGE | INTRAVENOUS | Status: AC
  Filled 2022-04-10: qty 10

## 2022-04-10 MED ORDER — ONDANSETRON HCL 4 MG/2ML IJ SOLN
INTRAMUSCULAR | Status: DC | PRN
  Administered 2022-04-10: 4 mg via INTRAVENOUS

## 2022-04-10 MED ORDER — ROCURONIUM BROMIDE 100 MG/10ML IV SOLN
INTRAVENOUS | Status: DC | PRN
Start: 2022-04-10 — End: 2022-04-10
  Administered 2022-04-10: 50 mg via INTRAVENOUS

## 2022-04-10 MED ORDER — LIDOCAINE HCL (CARDIAC) PF 100 MG/5ML IV SOSY
PREFILLED_SYRINGE | INTRAVENOUS | Status: DC | PRN
Start: 2022-04-10 — End: 2022-04-10
  Administered 2022-04-10: 60 mg via INTRAVENOUS

## 2022-04-10 MED ORDER — PHENYLEPHRINE 80 MCG/ML (10ML) SYRINGE FOR IV PUSH (FOR BLOOD PRESSURE SUPPORT)
PREFILLED_SYRINGE | INTRAVENOUS | Status: AC
  Filled 2022-04-10: qty 10

## 2022-04-10 MED ORDER — DEXAMETHASONE SODIUM PHOSPHATE 10 MG/ML IJ SOLN
INTRAMUSCULAR | Status: AC
  Filled 2022-04-10: qty 1

## 2022-04-10 MED ORDER — ONDANSETRON HCL 4 MG/2ML IJ SOLN: 4.0000 mg | Freq: Four times a day (QID) | INTRAMUSCULAR | Status: DC | PRN

## 2022-04-10 MED ORDER — PROPOFOL 10 MG/ML IV BOLUS
INTRAVENOUS | Status: DC | PRN
Start: 2022-04-10 — End: 2022-04-10
  Administered 2022-04-10: 70 mg via INTRAVENOUS

## 2022-04-10 MED ORDER — ENOXAPARIN SODIUM 40 MG/0.4ML IJ SOSY
40.0000 mg | PREFILLED_SYRINGE | INTRAMUSCULAR | Status: DC
  Administered 2022-04-10: 40 mg via SUBCUTANEOUS
  Filled 2022-04-10: qty 0.4

## 2022-04-10 MED ORDER — DEXAMETHASONE SODIUM PHOSPHATE 10 MG/ML IJ SOLN
INTRAMUSCULAR | Status: DC | PRN
  Administered 2022-04-10: 10 mg via INTRAVENOUS

## 2022-04-10 MED ORDER — LIDOCAINE HCL (PF) 2 % IJ SOLN
INTRAMUSCULAR | Status: AC
  Filled 2022-04-10: qty 5

## 2022-04-10 MED ORDER — PANTOPRAZOLE SODIUM 40 MG IV SOLR
40.0000 mg | Freq: Every day | INTRAVENOUS | Status: DC
  Administered 2022-04-10: 40 mg via INTRAVENOUS
  Filled 2022-04-10: qty 10

## 2022-04-10 MED ORDER — FENTANYL CITRATE (PF) 100 MCG/2ML IJ SOLN
25.0000 ug | INTRAMUSCULAR | Status: DC | PRN
  Administered 2022-04-10 (×2): 25 ug via INTRAVENOUS
  Administered 2022-04-10: 50 ug via INTRAVENOUS

## 2022-04-10 MED ORDER — BUPIVACAINE-EPINEPHRINE (PF) 0.5% -1:200000 IJ SOLN
INTRAMUSCULAR | Status: DC | PRN
  Administered 2022-04-10: 30 mL via PERINEURAL

## 2022-04-10 MED ORDER — FENTANYL CITRATE (PF) 100 MCG/2ML IJ SOLN
INTRAMUSCULAR | Status: AC
  Filled 2022-04-10: qty 2

## 2022-04-10 MED ORDER — SODIUM CHLORIDE 0.9 % IV SOLN: INTRAVENOUS | Status: DC

## 2022-04-10 MED ORDER — ONDANSETRON HCL 4 MG/2ML IJ SOLN: 4.0000 mg | Freq: Once | INTRAMUSCULAR | Status: DC | PRN

## 2022-04-10 MED ORDER — ACETAMINOPHEN 325 MG PO TABS: 650.0000 mg | ORAL_TABLET | Freq: Four times a day (QID) | ORAL | Status: DC | PRN

## 2022-04-10 MED ORDER — ACETAMINOPHEN 650 MG RE SUPP: 650.0000 mg | Freq: Four times a day (QID) | RECTAL | Status: DC | PRN

## 2022-04-10 MED ORDER — EPHEDRINE 5 MG/ML INJ
INTRAVENOUS | Status: AC
  Filled 2022-04-10: qty 5

## 2022-04-10 MED ORDER — IOHEXOL 300 MG/ML  SOLN
100.0000 mL | Freq: Once | INTRAMUSCULAR | Status: AC | PRN
  Administered 2022-04-10: 100 mL via INTRAVENOUS

## 2022-04-10 MED ORDER — IPRATROPIUM-ALBUTEROL 0.5-2.5 (3) MG/3ML IN SOLN
3.0000 mL | Freq: Once | RESPIRATORY_TRACT | Status: DC
Start: 2022-04-10 — End: 2022-04-11
  Filled 2022-04-10: qty 3

## 2022-04-10 MED ORDER — PIPERACILLIN-TAZOBACTAM 3.375 G IVPB
3.3750 g | Freq: Three times a day (TID) | INTRAVENOUS | Status: DC
  Administered 2022-04-10 – 2022-04-11 (×2): 3.375 g via INTRAVENOUS
  Filled 2022-04-10 (×2): qty 50

## 2022-04-10 MED ORDER — EPHEDRINE SULFATE (PRESSORS) 50 MG/ML IJ SOLN
INTRAMUSCULAR | Status: DC | PRN
  Administered 2022-04-10 (×2): 5 mg via INTRAVENOUS

## 2022-04-10 MED ORDER — SODIUM CHLORIDE 0.9 % IV SOLN
INTRAVENOUS | Status: DC | PRN
  Administered 2022-04-10: 500 mL

## 2022-04-10 MED ORDER — PROPOFOL 10 MG/ML IV BOLUS
INTRAVENOUS | Status: AC
  Filled 2022-04-10: qty 20

## 2022-04-10 MED ORDER — ROCURONIUM BROMIDE 10 MG/ML (PF) SYRINGE
PREFILLED_SYRINGE | INTRAVENOUS | Status: AC
  Filled 2022-04-10: qty 10

## 2022-04-10 MED ORDER — FENTANYL CITRATE (PF) 100 MCG/2ML IJ SOLN
INTRAMUSCULAR | Status: DC | PRN
  Administered 2022-04-10: 25 ug via INTRAVENOUS
  Administered 2022-04-10 (×2): 50 ug via INTRAVENOUS
  Administered 2022-04-10: 25 ug via INTRAVENOUS

## 2022-04-10 SURGICAL SUPPLY — 67 items
ADH SKN CLS APL DERMABOND .7 (GAUZE/BANDAGES/DRESSINGS) ×1
BAG INFUSER PRESSURE 100CC (MISCELLANEOUS) ×1 IMPLANT
BAG RETRIEVAL 10 (BASKET) ×1
BLADE SURG SZ11 CARB STEEL (BLADE) ×2 IMPLANT
CANNULA REDUC XI 12-8 STAPL (CANNULA) ×2
CANNULA REDUCER 12-8 DVNC XI (CANNULA) ×1 IMPLANT
COVER TIP SHEARS 8 DVNC (MISCELLANEOUS) ×1 IMPLANT
COVER TIP SHEARS 8MM DA VINCI (MISCELLANEOUS) ×2
DERMABOND ADVANCED (GAUZE/BANDAGES/DRESSINGS) ×1
DERMABOND ADVANCED .7 DNX12 (GAUZE/BANDAGES/DRESSINGS) ×1 IMPLANT
DRAPE ARM DVNC X/XI (DISPOSABLE) ×4 IMPLANT
DRAPE COLUMN DVNC XI (DISPOSABLE) ×1 IMPLANT
DRAPE DA VINCI XI ARM (DISPOSABLE) ×8
DRAPE DA VINCI XI COLUMN (DISPOSABLE) ×2
ELECT REM PT RETURN 9FT ADLT (ELECTROSURGICAL) ×2
ELECTRODE REM PT RTRN 9FT ADLT (ELECTROSURGICAL) ×1 IMPLANT
GLOVE BIOGEL PI IND STRL 6.5 (GLOVE) ×2 IMPLANT
GLOVE BIOGEL PI INDICATOR 6.5 (GLOVE) ×3
GLOVE SURG SYN 6.5 ES PF (GLOVE) ×6 IMPLANT
GLOVE SURG SYN 6.5 PF PI (GLOVE) ×2 IMPLANT
GOWN STRL REUS W/ TWL LRG LVL3 (GOWN DISPOSABLE) ×3 IMPLANT
GOWN STRL REUS W/TWL LRG LVL3 (GOWN DISPOSABLE) ×6
GRASPER SUT TROCAR 14GX15 (MISCELLANEOUS) IMPLANT
IRRIGATOR SUCT 8 DISP DVNC XI (IRRIGATION / IRRIGATOR) IMPLANT
IRRIGATOR SUCTION 8MM XI DISP (IRRIGATION / IRRIGATOR) ×2
IV NS 1000ML (IV SOLUTION) ×2
IV NS 1000ML BAXH (IV SOLUTION) IMPLANT
KIT PINK PAD W/HEAD ARE REST (MISCELLANEOUS) ×2 IMPLANT
KIT PINK PAD W/HEAD ARM REST (MISCELLANEOUS) ×1 IMPLANT
LABEL OR SOLS (LABEL) IMPLANT
MANIFOLD NEPTUNE II (INSTRUMENTS) ×2 IMPLANT
NDL INSUFFLATION 14GA 120MM (NEEDLE) ×1 IMPLANT
NEEDLE HYPO 22GX1.5 SAFETY (NEEDLE) ×2 IMPLANT
NEEDLE INSUFFLATION 14GA 120MM (NEEDLE) ×2 IMPLANT
OBTURATOR OPTICAL STANDARD 8MM (TROCAR) ×2
OBTURATOR OPTICAL STND 8 DVNC (TROCAR) ×1
OBTURATOR OPTICALSTD 8 DVNC (TROCAR) ×1 IMPLANT
PACK LAP CHOLECYSTECTOMY (MISCELLANEOUS) ×2 IMPLANT
RELOAD STAPLE 45 2.5 WHT DVNC (STAPLE) IMPLANT
RELOAD STAPLE 45 3.5 BLU DVNC (STAPLE) ×1 IMPLANT
RELOAD STAPLER 2.5X45 WHT DVNC (STAPLE) IMPLANT
RELOAD STAPLER 3.5X45 BLU DVNC (STAPLE) ×1 IMPLANT
SEAL CANN UNIV 5-8 DVNC XI (MISCELLANEOUS) ×3 IMPLANT
SEAL XI 5MM-8MM UNIVERSAL (MISCELLANEOUS) ×6
SEALER VESSEL DA VINCI XI (MISCELLANEOUS) ×2
SEALER VESSEL EXT DVNC XI (MISCELLANEOUS) IMPLANT
SET TUBE SMOKE EVAC HIGH FLOW (TUBING) ×2 IMPLANT
SOLUTION ELECTROLUBE (MISCELLANEOUS) ×2 IMPLANT
SPONGE T-LAP 4X18 ~~LOC~~+RFID (SPONGE) ×2 IMPLANT
STAPLER 45 DA VINCI SURE FORM (STAPLE) ×2
STAPLER 45 SUREFORM DVNC (STAPLE) ×1 IMPLANT
STAPLER CANNULA SEAL DVNC XI (STAPLE) ×1 IMPLANT
STAPLER CANNULA SEAL XI (STAPLE) ×2
STAPLER RELOAD 2.5X45 WHITE (STAPLE)
STAPLER RELOAD 2.5X45 WHT DVNC (STAPLE)
STAPLER RELOAD 3.5X45 BLU DVNC (STAPLE) ×1
STAPLER RELOAD 3.5X45 BLUE (STAPLE) ×2
SUT MNCRL AB 4-0 PS2 18 (SUTURE) ×2 IMPLANT
SUT VIC AB 2-0 SH 27 (SUTURE) ×2
SUT VIC AB 2-0 SH 27XBRD (SUTURE) ×1 IMPLANT
SUT VICRYL 0 AB UR-6 (SUTURE) ×2 IMPLANT
SUT VLOC 90 6 CV-15 VIOLET (SUTURE) ×1 IMPLANT
SYR 30ML LL (SYRINGE) ×1 IMPLANT
SYS BAG RETRIEVAL 10MM (BASKET) ×1
SYSTEM BAG RETRIEVAL 10MM (BASKET) ×1 IMPLANT
TRAY FOLEY MTR SLVR 16FR STAT (SET/KITS/TRAYS/PACK) ×1 IMPLANT
WATER STERILE IRR 500ML POUR (IV SOLUTION) ×2 IMPLANT

## 2022-04-10 NOTE — Anesthesia Preprocedure Evaluation (Signed)
Anesthesia Evaluation  ?Patient identified by MRN, date of birth, ID band ?Patient awake ? ? ? ?Reviewed: ?Allergy & Precautions, NPO status , Patient's Chart, lab work & pertinent test results ? ?History of Anesthesia Complications ?Negative for: history of anesthetic complications ? ?Airway ?Mallampati: III ? ?TM Distance: >3 FB ?Neck ROM: full ? ? ? Dental ? ?(+) Chipped, Poor Dentition, Missing, Upper Dentures ?  ?Pulmonary ?neg pulmonary ROS, neg shortness of breath, neg recent URI, former smoker,  ?  ?Pulmonary exam normal ? ? ? ? ? ? ? Cardiovascular ?Exercise Tolerance: Good ?(-) angina(-) Past MI negative cardio ROS ?Normal cardiovascular exam ? ? ?  ?Neuro/Psych ?PSYCHIATRIC DISORDERS Depression negative neurological ROS ?   ? GI/Hepatic ?negative GI ROS, Neg liver ROS, neg GERD  ,  ?Endo/Other  ?negative endocrine ROS ? Renal/GU ?negative Renal ROS  ?negative genitourinary ?  ?Musculoskeletal ? ? Abdominal ?  ?Peds ? Hematology ?negative hematology ROS ?(+)   ?Anesthesia Other Findings ?History reviewed. No pertinent past medical history. ? ?History reviewed. No pertinent surgical history. ? ?BMI   ? Body Mass Index: 18.30 kg/m?  ?  ? ? Reproductive/Obstetrics ?negative OB ROS ? ?  ? ? ? ? ? ? ? ? ? ? ? ? ? ?  ?  ? ? ? ? ? ? ? ? ?Anesthesia Physical ? ?Anesthesia Plan ? ?ASA: 2 ? ?Anesthesia Plan: General  ? ?Post-op Pain Management:   ? ?Induction: Intravenous ? ?PONV Risk Score and Plan: Dexamethasone, Ondansetron and Treatment may vary due to age or medical condition ? ?Airway Management Planned: Oral ETT ? ?Additional Equipment:  ? ?Intra-op Plan:  ? ?Post-operative Plan: Extubation in OR ? ?Informed Consent: I have reviewed the patients History and Physical, chart, labs and discussed the procedure including the risks, benefits and alternatives for the proposed anesthesia with the patient or authorized representative who has indicated his/her understanding and  acceptance.  ? ? ? ?Dental Advisory Given ? ?Plan Discussed with: Anesthesiologist, CRNA and Surgeon ? ?Anesthesia Plan Comments: (Patient consented for risks of anesthesia including but not limited to:  ?- adverse reactions to medications ?- risk of airway placement if required ?- damage to eyes, teeth, lips or other oral mucosa ?- nerve damage due to positioning  ?- sore throat or hoarseness ?- Damage to heart, brain, nerves, lungs, other parts of body or loss of life ? ?Patient voiced understanding.)  ? ? ? ? ? ? ?Anesthesia Quick Evaluation ? ?

## 2022-04-10 NOTE — ED Notes (Signed)
All jewelry and dentures removed and given to family at bedside. Patient remains with glasses. Transporter at bedside and will deliver via bed. ?

## 2022-04-10 NOTE — Anesthesia Procedure Notes (Signed)
Procedure Name: Intubation ?Date/Time: 04/10/2022 8:56 PM ?Performed by: Lendon Colonel, CRNA ?Pre-anesthesia Checklist: Patient identified, Patient being monitored, Timeout performed, Emergency Drugs available and Suction available ?Patient Re-evaluated:Patient Re-evaluated prior to induction ?Oxygen Delivery Method: Circle system utilized ?Preoxygenation: Pre-oxygenation with 100% oxygen ?Induction Type: IV induction ?Ventilation: Mask ventilation without difficulty ?Laryngoscope Size: 3 and McGraph ?Grade View: Grade I ?Tube type: Oral ?Tube size: 7.0 mm ?Number of attempts: 1 ?Airway Equipment and Method: Stylet ?Placement Confirmation: ETT inserted through vocal cords under direct vision, positive ETCO2 and breath sounds checked- equal and bilateral ?Secured at: 20 cm ?Tube secured with: Tape ?Dental Injury: Teeth and Oropharynx as per pre-operative assessment  ? ? ? ? ?

## 2022-04-10 NOTE — Transfer of Care (Signed)
Immediate Anesthesia Transfer of Care Note ? ?Patient: Ann Cordova ? ?Procedure(s) Performed: XI ROBOTIC LAPAROSCOPIC ASSISTED APPENDECTOMY ? ?Patient Location: PACU ? ?Anesthesia Type:General ? ?Level of Consciousness: awake, alert  and patient cooperative ? ?Airway & Oxygen Therapy: Patient Spontanous Breathing and Patient connected to face mask oxygen ? ?Post-op Assessment: Report given to RN and Post -op Vital signs reviewed and stable ? ?Post vital signs: Reviewed and stable ? ?Last Vitals:  ?Vitals Value Taken Time  ?BP    ?Temp    ?Pulse 93 04/10/22 2227  ?Resp 34 04/10/22 2227  ?SpO2 100 % 04/10/22 2227  ?Vitals shown include unvalidated device data. ? ?Last Pain:  ?Vitals:  ? 04/10/22 1247  ?TempSrc:   ?PainSc: 6   ?   ? ?  ? ?Complications: No notable events documented. ?

## 2022-04-10 NOTE — ED Provider Notes (Signed)
? ?[facility] ?Provider Note ? ? ? Event Date/Time  ? First MD Initiated Contact with Patient 04/10/22 1454   ?  (approximate) ? ? ?History  ? ?Chest Pain ? ? ?HPI ? ?JENET DURIO is a 71 y.o. female with no significant history of cardiac disease but family history of cardiac disease presents to the ER for evaluation of abdominal pain that radiate up into her chest.  Patient felt she was constipated over the weekend.  Did take MiraLAX move bowels no melena and hematochezia.  Not having any fevers is having primarily right-sided abdominal pain.  Does still have appendicitis.  Just recently had colonoscopy.  She does have a remote history of smoking does not feel like she is wheezing does not feel short of breath. ?  ? ? ?Physical Exam  ? ?Triage Vital Signs: ?ED Triage Vitals  ?Enc Vitals Group  ?   BP 04/10/22 1246 136/71  ?   Pulse Rate 04/10/22 1246 75  ?   Resp 04/10/22 1246 18  ?   Temp 04/10/22 1246 98.3 ?F (36.8 ?C)  ?   Temp Source 04/10/22 1246 Oral  ?   SpO2 04/10/22 1246 96 %  ?   Weight 04/10/22 1248 110 lb 0.2 oz (49.9 kg)  ?   Height 04/10/22 1248 '5\' 5"'$  (1.651 m)  ?   Head Circumference --   ?   Peak Flow --   ?   Pain Score 04/10/22 1247 6  ?   Pain Loc --   ?   Pain Edu? --   ?   Excl. in [place]? --   ? ? ?Most recent vital signs: ?Vitals:  ? 04/10/22 1630 04/10/22 1700  ?BP: 127/79 133/77  ?Pulse: 66 70  ?Resp: 13 19  ?Temp:    ?SpO2: 96% 97%  ? ? ? ?Constitutional: Alert  ?Eyes: Conjunctivae are normal.  ?Head: Atraumatic. ?Nose: No congestion/rhinnorhea. ?Mouth/Throat: Mucous membranes are moist.   ?Neck: Painless ROM.  ?Cardiovascular:   Good peripheral circulation. ?Respiratory: Normal respiratory effort.  No retractions.  ?Gastrointestinal: Soft and nontender.  ?Musculoskeletal:  no deformity ?Neurologic:  MAE spontaneously. No gross focal neurologic deficits are appreciated.  ?Skin:  Skin is warm, dry and intact. No rash noted. ?Psychiatric: Mood and affect are normal.  Speech and behavior are normal. ? ? ? ?ED Results / Procedures / Treatments  ? ?Labs ?(all labs ordered are listed, but only abnormal results are displayed) ?Labs Reviewed  ?BASIC METABOLIC PANEL - Abnormal; Notable for the following components:  ?    Result Value  ? Sodium 134 (*)   ? Potassium 3.4 (*)   ? Glucose, Bld 120 (*)   ? All other components within normal limits  ?CBC  ?BLOOD GAS, VENOUS  ?HIV ANTIBODY (ROUTINE TESTING W REFLEX)  ?TROPONIN I (HIGH SENSITIVITY)  ?TROPONIN I (HIGH SENSITIVITY)  ? ? ? ?EKG ? ?ED ECG REPORT ?I, Merlyn Lot, the attending physician, personally viewed and interpreted this ECG. ? ? Date: 04/10/2022 ? EKG Time: 12:44 ? Rate: 75 ? Rhythm: sinus ? Axis: normal ? Intervals:normal ? ST&T Change: no stemi, no depression ? ? ? ?RADIOLOGY ?Please see ED Course for my review and interpretation. ? ?I personally reviewed all radiographic images ordered to evaluate for the above acute complaints and reviewed radiology reports and findings.  These findings were personally discussed with the patient.  Please see medical record for radiology report. ? ? ? ?PROCEDURES: ? ?Critical Care  performed: No ? ?Procedures ? ? ?MEDICATIONS ORDERED IN ED: ?Medications  ?ipratropium-albuterol (DUONEB) 0.5-2.5 (3) MG/3ML nebulizer solution 3 mL (3 mLs Nebulization Not Given 04/10/22 1716)  ?FLUoxetine (PROZAC) capsule 40 mg (has no administration in time range)  ?piperacillin-tazobactam (ZOSYN) IVPB 3.375 g (has no administration in time range)  ?enoxaparin (LOVENOX) injection 40 mg (has no administration in time range)  ?0.9 %  sodium chloride infusion (has no administration in time range)  ?acetaminophen (TYLENOL) tablet 650 mg (has no administration in time range)  ?  Or  ?acetaminophen (TYLENOL) suppository 650 mg (has no administration in time range)  ?HYDROcodone-acetaminophen (NORCO/VICODIN) 5-325 MG per tablet 1-2 tablet (has no administration in time range)  ?morphine (PF) 4 MG/ML injection 4  mg (has no administration in time range)  ?ondansetron (ZOFRAN-ODT) disintegrating tablet 4 mg (has no administration in time range)  ?  Or  ?ondansetron (ZOFRAN) injection 4 mg (has no administration in time range)  ?pantoprazole (PROTONIX) injection 40 mg (has no administration in time range)  ?sodium chloride 0.9 % bolus 500 mL (0 mLs Intravenous Stopped 04/10/22 1758)  ?iohexol (OMNIPAQUE) 300 MG/ML solution 100 mL (100 mLs Intravenous Contrast Given 04/10/22 1545)  ? ? ? ?IMPRESSION / MDM / ASSESSMENT AND PLAN / ED COURSE  ?I reviewed the triage vital signs and the nursing notes. ?             ?               ? ?Differential diagnosis includes, but is not limited to, enteritis, colitis, diverticulitis, appendicitis, stone, pneumonia, ACS, AAA, gas ? ?Presenting to the ER nontoxic-appearing with symptoms as described above does have some lower abdominal pain on exam.  Given age and risk factors with recent colonoscopy will order CT imaging to further evaluate.  EKG with some nonspecific changes troponin is negative she is currently chest pain-free.  She denies any shortness of breath.  Does not seem consistent with PE or dissection not consistent with pneumonia or CHF or COPD. ? ? ?Clinical Course as of 04/10/22 1801  ?Mon Apr 10, 2022  ?1618 CT imaging on my review interpretation does not show any evidence of obstruction will await formal radiology report. [PR]  ?74 VBG is reassuring. [PR]  ?7 CT imaging concerning for appendicitis she is currently afebrile but she is tender in the right lower quadrant given her pain will consult general surgery. [PR]  ?1730 Case discussed in consultation with Dr. Peyton Najjar of general surgery who will evaluate patient at bedside. [PR]  ?  ?Clinical Course User Index ?[PR] Merlyn Lot, MD  ? ? ? ?FINAL CLINICAL IMPRESSION(S) / ED DIAGNOSES  ? ?Final diagnoses:  ?Acute appendicitis, unspecified acute appendicitis type  ? ? ? ?Rx / DC Orders  ? ?ED Discharge Orders   ? ?  None  ? ?  ? ? ? ?Note:  This document was prepared using Dragon voice recognition software and may include unintentional dictation errors. ? ?  ?Merlyn Lot, MD ?04/10/22 1801 ? ?

## 2022-04-10 NOTE — ED Triage Notes (Signed)
Pt here with cp that happened on Sat. Pt states that she had a sudden, sharp pain in the center of her chest with no radiation. Pt believes that it may be gas.  ?

## 2022-04-10 NOTE — Telephone Encounter (Signed)
Patient has checked in at ED  ?

## 2022-04-10 NOTE — Op Note (Signed)
Pre-op Diagnosis: Acute appendicitis  ? ?Post op Diagnosis: Acute perforated appendicitis ? ?Procedure: Robotic assisted laparoscopic appendectomy. ? ?Anesthesia: GETA ? ?Surgeon: Herbert Pun, MD, FACS ? ?Wound Classification: Contaminated ? ?Specimen: Appendix ? ?Complications: None ? ?Estimated Blood Loss: 3 mL ? ? ?Indications: Patient is a 71 y.o. female  presented with above right lower quadrant pain. CT scan shows acute appendicitis.    ? ?FIndings: ?1.  Contained perforated appendicitis. Perforation contained by fallopian tube ?2. No peri-appendiceal abscess or phlegmon ?3. Normal anatomy ?4. Adequate hemostasis.  ? ? ? ? ? ? ? ? ? ? ?Description of procedure: The patient was placed on the operating table in the supine position. General anesthesia was induced. A time-out was completed verifying correct patient, procedure, site, positioning, and implant(s) and/or special equipment prior to beginning this procedure. The abdomen was prepped and draped in the usual sterile fashion.  ? ?Palmer's point located and Veress needle was inserted.  After confirming 2 clicks and a positive saline drop test, gas insufflation was initiated until the abdominal pressure was measured at 15 mmHg.  Afterwards, the Veress needle was removed and a 8 mm port was placed in left upper quadrant area using Optiview technique.  After local was infused, 3 additional incision on the left abdominal wall were made 5 cm apart.  An 12 mm port and two other 8 mm ports were placed under direct visualization.  No injuries from trocar placements were noted.  The table was placed in the Trendelenburg position with the right side elevated.  With the use of Tip up grasper, Force Bipolar and Suction, a contained perforated appendicitis was identified stuck to the right lower quadrant and fallopian tube. A very difficult and time consuming dissection with the suction, blunt dissection was needed to separate and isolate the appendix. Was  mobilized, a window created at base of appendix in the mesentery.   ? ?An  blue load linear cutting stapler was then used to divide and staple the base of the appendix. Mesoappendix was divided with Vessel sealer. The appendix was placed in an endoscopic retrieval bag and removed.  ? ?The appendiceal stump was examined and hemostasis noted. No other pathology was identified within pelvis. The pelvis and right lower quadrant fluid was suctioned. No abscess identified. The 12 mm trocar removed and port site closed with PMI using 0 vicryl under direct vision. Remaining trocars were removed under direct vision. No bleeding was noted.The abdomen was allowed to collapse.  All skin incisions then closed with subcuticular sutures Monocryl 4-0.  Wounds then dressed with dermabond. ? ?The patient tolerated the procedure well, awakened from anesthesia and was taken to the postanesthesia care unit in satisfactory condition.  Sponge count and instrument count correct at the end of the procedure. ? ?

## 2022-04-10 NOTE — Telephone Encounter (Signed)
[facility] - Client ?TELEPHONE ADVICE RECORD ?AccessNurse? ?Patient ?Name: ?Ann RO ?[place] ?Gender: Female ?DOB: 29-Jun-1951 ?Age: 71 Y 1 M 8 D ?Return ?Phone ?Number: ?[id number] ?(Primary) ?Address: ?City/ ?State/ ?Zip: [place] ? [address] ?Client [facility] - Client ?Client Site [facility] ?Provider Alma Friendly - NP ?Contact Type Call ?Who Is Calling Patient / Member / Family / Caregiver ?Call Type Triage / Clinical ?Relationship To Patient Self ?Return Phone Number [phone] (Primary) ?Chief Complaint CHEST PAIN - pain, pressure, heaviness or ?tightness ?Reason for Call Symptomatic / Request for Health Information ?Initial Comment Caller states she is having constipation and ?cramping. States she is having diarrhea and chest ?soreness. Wants to know what they should be ?doing. ?Translation No ?Nurse Assessment ?Nurse: Aurelio Jew, RN, M[place] Date/Time Eilene Ghazi Time): 04/09/2022 8:04:22 PM ?Confirm and document reason for call. If ?symptomatic, describe symptoms. ?---Caller states she is having constipation and ?cramping. Yesterday she felt like she had to poop but ?could not and started experiencing cramping. She had ?cramps in her low belly and her abdomen hurt to touch. ?She started with some sharp chest pain. The chest pain ?resolves when she is not moving but comes back when ?she is moving around. Today she has had diarrhea ?twice, she still is having some abdominal pain and the ?chest pain continues. Denies SOB or dizziness. She has ?been sweaty. Denies other sx at this time. ?Does the patient have any new or worsening ?symptoms? ---Yes ?Will a triage be completed? ---Yes ?Related visit to physician within the last 2 weeks? ---No ?Does the PT have any chronic conditions? (i.e. ?diabetes, asthma, this includes High risk factors for ?pregnancy, etc.) ?---No ?Is this a behavioral health or substance abuse call?  ---No ?Guidelines ?Guideline Title Affirmed Question Affirmed Notes Nurse Date/Time (Eastern ?Time) ?Chest Pain [1] Chest pain lasts > ?5 minutes AND [2] ?age > 71 ?Aurelio Jew, RN, ?Morgyn ?04/09/2022 8:07:35 ?PM ?PLEASE NOTE: All timestamps contained within this report are represented as [place] Standard Time. ?CONFIDENTIALTY NOTICE: This fax transmission is intended only for the addressee. It contains information that is legally privileged, confidential or ?otherwise protected from use or disclosure. If you are not the intended recipient, you are strictly prohibited from reviewing, disclosing, copying using ?or disseminating any of this information or taking any action in reliance on or regarding this information. If you have received this fax in error, please ?notify us immediately by telephone so that we can arrange for its return to [place]. Phone: [phone], Toll-Free: [phone], Fax: [phone] ?Page: 2 of 2 ?Call Id: [id number] ?Disp. Time (Eastern ?Time) Disposition Final User ?04/09/2022 8:02:07 PM Send to Urgent Queue Fanny Bien ?04/09/2022 8:14:50 PM 911 Outcome Documentation Aurelio Jew, RN, Morgyn ?Reason: ON 5 min follow up call she ?said that she was able to get in touch ?with 911. ?04/09/2022 8:09:05 PM Call EMS 911 Now Yes Aurelio Jew, RN, [place] ?Caller Disagree/Comply Comply ?Caller Understands Yes ?PreDisposition InappropriateToAsk ?Care Advice Given Per Guideline ?CALL EMS 911 NOW: * Immediate medical attention is needed. You need to hang up and call 911 (or an ambulance). * Triager ?Discretion: I'll call you back in a few minutes to be sure you were able to reach them. CARE ADVICE given per Chest Pain (Adult) ?guideline ?

## 2022-04-10 NOTE — ED Notes (Signed)
Report given to OR nurse

## 2022-04-10 NOTE — Telephone Encounter (Signed)
Sending note to Gentry Fitz NP and [facility] CMA. Per access nurse note for 04/10/22 pt complied with going to ED. ?

## 2022-04-10 NOTE — Telephone Encounter (Signed)
Agree with plan for ER.  ? ?Routing to MA to check back to see that patient arrived at ER  ?

## 2022-04-10 NOTE — Telephone Encounter (Signed)
[facility] - Client ?TELEPHONE ADVICE RECORD ?AccessNurse? ?Patient ?Name: ?Ann [place]ova ?ARK ?Gender: Female ?DOB: 15-Jun-1951 ?Age: 71 Y 1 M 8 D ?Return ?Phone ?Number: ?[id number] ?(Primary), ?[id number] ?(Secondary) ?Address: ?City/ ?State/ ?Zip: [place] ? [address] ?Client [facility] - Client ?Client Site [facility] ?Provider Alma Friendly - NP ?Contact Type Call ?Who Is Calling Patient / Member / Family / Caregiver ?Call Type Triage / Clinical ?Relationship To Patient Self ?Return Phone Number [phone] (Primary) ?Chief Complaint CHEST PAIN - pain, pressure, heaviness or ?tightness ?Reason for Call Symptomatic / Request for Health Information ?Initial Comment Caller states calling from answer service, chest ?pain and congestion. ?Translation No ?Nurse Assessment ?Nurse: Sheppard Plumber, RN, [place]ill Bamberg Date/Time (Eastern Time): 04/10/2022 9:49:59 AM ?Confirm and document reason for call. If ?symptomatic, describe symptoms. ?---caller states saturday she woke up constipated and ?there never happens. had abd cramps. pains went up ?into her chest. had to have someone drive her home ?from work. abd was still sore but not sharp anymore ?and chest was sore. her dtr is a nurse and suggested to ?take miralax. then sunday morning had diarrhea 2x and ?that is normal for her. was still sore. she called after ?hours yesterday and was told to call 911 and she did. ?paramedics came and did not have heart monitor. vitals ?were normal. the rest of the day she was resting and ?took more miralax. still sore in abd and chest ?Does the patient have any new or worsening ?symptoms? ---Yes ?Will a triage be completed? ---Yes ?Related visit to physician within the last 2 weeks? ---No ?Does the PT have any chronic conditions? (i.e. ?diabetes, asthma, this includes High risk factors for ?pregnancy, etc.) ?---No ?Is this a behavioral health or substance abuse  call? ---No ?Guidelines ?Guideline Title Affirmed Question Affirmed Notes Nurse Date/Time (Eastern ?Time) ?Chest Pain [1] Chest pain lasts > ?5 minutes AND [2] ?age > 65 ?[place], RN, ?Estill Bamberg ?04/10/2022 9:57:04 ?AM ?PLEASE NOTE: All timestamps contained within this report are represented as [place] Standard Time. ?CONFIDENTIALTY NOTICE: This fax transmission is intended only for the addressee. It contains information that is legally privileged, confidential or ?otherwise protected from use or disclosure. If you are not the intended recipient, you are strictly prohibited from reviewing, disclosing, copying using ?or disseminating any of this information or taking any action in reliance on or regarding this information. If you have received this fax in error, please ?notify us immediately by telephone so that we can arrange for its return to [place]. Phone: [phone], Toll-Free: [phone], Fax: [phone] ?Page: 2 of 2 ?Call Id: [id number] ?Disp. Time (Eastern ?Time) Disposition Final User ?04/10/2022 9:46:38 AM Send to Urgent Loetta Rough ?04/10/2022 10:04:06 AM 911 Outcome Documentation [place], RN, Estill Bamberg ?Reason: refused 911. dtr will take her ?04/10/2022 10:01:35 AM Call EMS 911 Now Yes [place], RN, Estill Bamberg ?Caller Disagree/Comply Comply ?Caller Understands Yes ?PreDisposition Did not know what to do ?Care Advice Given Per Guideline ?CARE ADVICE given per Chest Pain (Adult) guideline. CALL EMS 911 NOW: * Immediate medical attention is needed. You need ?to hang up and call 911 (or an ambulance) ?

## 2022-04-10 NOTE — H&P (Signed)
SURGICAL CONSULTATION NOTE  ? ?HISTORY OF PRESENT ILLNESS (HPI):  ?71 y.o. female presented to [facility] ED for evaluation of abdominal pain. Patient reports having abdominal pain since 2 days ago.  Pain started in the paramedical area and radiated to the upper abdomen.  The pain localized to the right lower quadrant.  There is no alleviating factors.  Aggravating factor is applying pressure.  Patient denies any fever or chills.  Patient endorses having nausea and vomiting.  Also endorses having anorexia. ? ?At the ED she was found without leukocytosis.  On physical exam there is tenderness to palpation in the right lower quadrant.  CT scan of the abdomen and pelvis shows thickening of the appendix with associated fat stranding.  There is no free air or free fluid.  There is no abscess.  I personally evaluated the images.  Due to the chest pain she had cardiac work-up with normal EKG and normal troponin levels.  At the moment of my evaluation patient denies chest pain or shortness of breath. ? ?Surgery is consulted by Dr. Quentin Cornwall in this context for evaluation and management of acute appendicitis. ? ?PAST MEDICAL HISTORY ([facility]):  ?History reviewed. No pertinent past medical history.  ? ?PAST SURGICAL HISTORY ([place]):  ?Past Surgical History:  ?Procedure Laterality Date  ? COLONOSCOPY WITH PROPOFOL N/A 03/21/2022  ? Procedure: COLONOSCOPY WITH PROPOFOL;  Surgeon: Jonathon Bellows, MD;  Location: [facility] ENDOSCOPY;  Service: Gastroenterology;  Laterality: N/A;  ?  ? ?MEDICATIONS:  ?Prior to Admission medications   ?Medication Sig Start Date End Date Taking? Authorizing Provider  ?alendronate (FOSAMAX) 70 MG tablet TAKE 1 TABLET BY MOUTH WEEKLY ON AN EMPTY STOMACH WITH WATER ONLY. NO FOOD FOR 30 MINUTES AND DO NOT LAY DOWN FOR ONE HOURS AFTER 02/26/22   Pleas Koch, NP  ?FLUoxetine (PROZAC) 40 MG capsule Take 1 capsule (40 mg total) by mouth daily. For depression 02/21/22   Pleas Koch, NP  ?  ? ?ALLERGIES:  ?No Known  Allergies  ? ?SOCIAL HISTORY:  ?Social History  ? ?Socioeconomic History  ? Marital status: Married  ?  Spouse name: Not on file  ? Number of children: Not on file  ? Years of education: Not on file  ? Highest education level: Not on file  ?Occupational History  ? Not on file  ?Tobacco Use  ? Smoking status: Former  ? Smokeless tobacco: Never  ?Vaping Use  ? Vaping Use: Never used  ?Substance and Sexual Activity  ? Alcohol use: No  ? Drug use: Not Currently  ? Sexual activity: Not on file  ?Other Topics Concern  ? Not on file  ?Social History Narrative  ? Married.  ? 2 children, 6 grand children.  ? Retired. Worked in Scientist, research (medical).  ? Takes care of her mother and husband.   ? ?Social Determinants of Health  ? ?Financial Resource Strain: Low Risk   ? Difficulty of Paying Living Expenses: Not hard at all  ?Food Insecurity: No Food Insecurity  ? Worried About Charity fundraiser in the Last Year: Never true  ? Ran Out of Food in the Last Year: Never true  ?Transportation Needs: No Transportation Needs  ? Lack of Transportation (Medical): No  ? Lack of Transportation (Non-Medical): No  ?Physical Activity: Sufficiently Active  ? Days of Exercise per Week: 5 days  ? Minutes of Exercise per Session: 150+ min  ?Stress: No Stress Concern Present  ? Feeling of Stress : Only a little  ?  Social Connections: Moderately Integrated  ? Frequency of Communication with Friends and Family: More than three times a week  ? Frequency of Social Gatherings with Friends and Family: More than three times a week  ? Attends Religious Services: More than 4 times per year  ? Active Member of Clubs or Organizations: No  ? Attends Archivist Meetings: Never  ? Marital Status: Married  ?Intimate Partner Violence: Not At Risk  ? Fear of Current or Ex-Partner: No  ? Emotionally Abused: No  ? Physically Abused: No  ? Sexually Abused: No  ?  ? ? ?FAMILY HISTORY:  ?No family history on file.  ? ?REVIEW OF SYSTEMS:  ?Constitutional: denies weight  loss, fever, chills, or sweats  ?Eyes: denies any other vision changes, history of eye injury  ?ENT: denies sore throat, hearing problems  ?Respiratory: denies shortness of breath, wheezing  ?Cardiovascular: denies chest pain, palpitations  ?Gastrointestinal: positive abdominal pain, nausea and vomiting ?Genitourinary: denies burning with urination or urinary frequency ?Musculoskeletal: denies any other joint pains or cramps  ?Skin: denies any other rashes or skin discolorations  ?Neurological: denies any other headache, dizziness, weakness  ?Psychiatric: denies any other depression, anxiety  ? ?All other review of systems were negative  ? ?VITAL SIGNS:  ?Temp:  [98.3 ?F (36.8 ?C)] 98.3 ?F (36.8 ?C) (04/17 1246) ?Pulse Rate:  [63-75] 70 (04/17 1700) ?Resp:  [13-19] 19 (04/17 1700) ?BP: (127-136)/(71-79) 133/77 (04/17 1700) ?SpO2:  [92 %-97 %] 97 % (04/17 1700) ?Weight:  [49.9 kg] 49.9 kg (04/17 1248)     Height: '5\' 5"'$  (165.1 cm) Weight: 49.9 kg BMI (Calculated): 18.31  ? ?INTAKE/OUTPUT:  ?This shift: No intake/output data recorded.  ?Last 2 shifts: '@IOLAST2SHIFTS'$ @  ? ?PHYSICAL EXAM:  ?Constitutional:  ?-- Normal body habitus  ?-- Awake, alert, and oriented x3  ?Eyes:  ?-- Pupils equally round and reactive to light  ?-- No scleral icterus  ?Ear, nose, and throat:  ?-- No jugular venous distension  ?Pulmonary:  ?-- No crackles  ?-- Equal breath sounds bilaterally ?-- Breathing non-labored at rest ?Cardiovascular:  ?-- S1, S2 present  ?-- No pericardial rubs ?Gastrointestinal:  ?-- Abdomen soft, tender to palpation in the right lower quadrant, non-distended, no guarding or rebound tenderness ?-- No abdominal masses appreciated, pulsatile or otherwise  ?Musculoskeletal and Integumentary:  ?-- Wounds or skin discoloration: None appreciated ?-- Extremities: B/L UE and LE FROM, hands and feet warm, no edema  ?Neurologic:  ?-- Motor function: intact and symmetric ?-- Sensation: intact and symmetric ? ? ?Labs:  ? ?  Latest  Ref Rng & Units 04/10/2022  ? 12:51 PM 02/21/2022  ?  2:38 PM 02/09/2021  ?  2:44 PM  ?CBC  ?WBC 4.0 - 10.5 K/uL 9.6   5.9   6.6    ?Hemoglobin 12.0 - 15.0 g/dL 12.5   12.7   13.7    ?Hematocrit 36.0 - 46.0 % 38.7   37.2   40.1    ?Platelets 150 - 400 K/uL 303   383.0   345.0    ? ? ?  Latest Ref Rng & Units 04/10/2022  ? 12:51 PM 02/21/2022  ?  2:38 PM 02/09/2021  ?  2:44 PM  ?CMP  ?Glucose 70 - 99 mg/dL 120   81   89    ?BUN 8 - 23 mg/dL '16   11   17    '$ ?Creatinine 0.44 - 1.00 mg/dL 0.75   0.73   0.78    ?  Sodium 135 - 145 mmol/L 134   138   138    ?Potassium 3.5 - 5.1 mmol/L 3.4   4.0   3.9    ?Chloride 98 - 111 mmol/L 99   102   103    ?CO2 22 - 32 mmol/L '25   27   28    '$ ?Calcium 8.9 - 10.3 mg/dL 9.3   9.5   9.6    ?Total Protein 6.0 - 8.3 g/dL  7.0   7.3    ?Total Bilirubin 0.2 - 1.2 mg/dL  0.5   0.6    ?Alkaline Phos 39 - 117 U/L  75   62    ?AST 0 - 37 U/L  16   17    ?ALT 0 - 35 U/L  10   12    ? ? ?Imaging studies:  ?EXAM: ?CT ABDOMEN AND PELVIS WITH CONTRAST ?  ?TECHNIQUE: ?Multidetector CT imaging of the abdomen and pelvis was performed ?using the standard protocol following bolus administration of ?intravenous contrast. ?  ?RADIATION DOSE REDUCTION: This exam was performed according to the ?departmental dose-optimization program which includes automated ?exposure control, adjustment of the mA and/or kV according to ?patient size and/or use of iterative reconstruction technique. ?  ?CONTRAST:  13m OMNIPAQUE IOHEXOL 300 MG/ML  SOLN ?  ?COMPARISON:  CT abdomen pelvis 08/23/2007 ?  ?FINDINGS: ?Lower chest: No acute abnormality. ?  ?Hepatobiliary: No focal liver abnormality is seen. The gallbladder ?is unremarkable. ?  ?Pancreas: Unremarkable. No pancreatic ductal dilatation or ?surrounding inflammatory changes. ?  ?Spleen: Normal in size without focal abnormality. ?  ?Adrenals/Urinary Tract: Adrenal glands are unremarkable. No ?hydronephrosis or nephrolithiasis. The bladder is mildly distended. ?  ?Stomach/Bowel:  Probable tiny hiatal hernia. No evidence of bowel ?obstruction.There is a retrocecal tubular structure favored to be ?the appendix which appears enlarged measuring up to 9 mm in diameter ?(coronal images 43-

## 2022-04-11 ENCOUNTER — Encounter: Payer: Self-pay | Admitting: General Surgery

## 2022-04-11 LAB — BLOOD GAS, VENOUS
Acid-Base Excess: 1 mmol/L (ref 0.0–2.0)
Bicarbonate: 27.1 mmol/L (ref 20.0–28.0)
O2 Saturation: 48.8 %
Patient temperature: 37
pCO2, Ven: 48 mmHg (ref 44–60)
pH, Ven: 7.36 (ref 7.25–7.43)

## 2022-04-11 MED ORDER — HYDROCODONE-ACETAMINOPHEN 5-325 MG PO TABS: 1.0000 | ORAL_TABLET | ORAL | 0 refills | Status: AC | PRN

## 2022-04-11 NOTE — Discharge Summary (Signed)
?  Patient ID: ?Ann Cordova ?MRN: [id number] ?DOB/AGE: 70-08-1951 70 y.o. ? ?Admit date: 04/10/2022 ?Discharge date: 04/11/2022 ? ? ?Discharge Diagnoses:  ?Principal Problem: ?  Acute appendicitis with localized peritonitis ? ? ?Procedures: Robotic assisted laparoscopic appendectomy ? ?Hospital Course: Patient with acute perforated appendicitis.  She underwent robotic assisted laparoscopic appendectomy.  Patient has been tolerating the procedure well.  She is recovering adequately.  Pain controlled.  Patient tolerating diet.  Patient ambulating.  Wounds are dry and clean. ? ?Physical Exam ?Cardiovascular:  ?   Rate and Rhythm: Normal rate and regular rhythm.  ?   Heart sounds: Normal heart sounds.  ?Pulmonary:  ?   Effort: Pulmonary effort is normal.  ?   Breath sounds: Normal breath sounds.  ?Abdominal:  ?   General: Bowel sounds are normal.  ?   Palpations: Abdomen is soft.  ?Skin: ?   General: Skin is warm.  ?   Capillary Refill: Capillary refill takes less than 2 seconds.  ?Neurological:  ?   Mental Status: She is alert and oriented to person, place, and time.  ? ? ? ?Consults: None ? ?Disposition: Discharge disposition: 01-Home or Self Care ? ? ? ? ? ? ?Discharge Instructions   ? ? Diet - low sodium heart healthy   Complete by: As directed ?  ? Increase activity slowly   Complete by: As directed ?  ? ?  ? ?Allergies as of 04/11/2022   ?No Known Allergies ?  ? ?  ?Medication List  ?  ? ?TAKE these medications   ? ?alendronate 70 MG tablet ?Commonly known as: FOSAMAX ?TAKE 1 TABLET BY MOUTH WEEKLY ON AN EMPTY STOMACH WITH WATER ONLY. NO FOOD FOR 30 MINUTES AND DO NOT LAY DOWN FOR ONE HOURS AFTER ?  ?FLUoxetine 40 MG capsule ?Commonly known as: PROZAC ?Take 1 capsule (40 mg total) by mouth daily. For depression ?  ?HYDROcodone-acetaminophen 5-325 MG tablet ?Commonly known as: Norco ?Take 1 tablet by mouth every 4 (four) hours as needed for up to 3 days for moderate pain. ?  ? ?  ? ? Follow-up Information   ? ?  Herbert Pun, MD Follow up in 2 week(s).   ?Specialty: General Surgery ?Why: Follow up after appendectomy ?Contact information: ?[address] ?[place] [address] ?[phone] ? ? ?  ?  ? ?  ?  ? ?  ? ? ?

## 2022-04-11 NOTE — TOC Initial Note (Signed)
Transition of Care (TOC) - Initial/Assessment Note  ? ? ?Patient Details  ?Name: Ann Cordova ?MRN: [id number] ?Date of Birth: 25-Jul-1951 ? ?Transition of Care (TOC) CM/SW Contact:    ?Laurena Slimmer, RN ?Phone Number: ?04/11/2022, 2:34 PM ? ?Clinical Narrative:                 ?Obs letter given. No other TOC needs. TOC signing off.  ? ? ?  ?  ? ? ?Patient Goals and CMS Choice ?  ?  ?  ? ?Expected Discharge Plan and Services ?  ?  ?  ?  ?  ?Expected Discharge Date: 04/11/22               ?  ?  ?  ?  ?  ?  ?  ?  ?  ?  ? ?Prior Living Arrangements/Services ?  ?  ?  ?       ?  ?  ?  ?  ? ?Activities of Daily Living ?Home Assistive Devices/Equipment: None ?ADL Screening (condition at time of admission) ?Patient's cognitive ability adequate to safely complete daily activities?: Yes ?Is the patient deaf or have difficulty hearing?: No ?Does the patient have difficulty seeing, even when wearing glasses/contacts?: No ?Does the patient have difficulty concentrating, remembering, or making decisions?: No ?Patient able to express need for assistance with ADLs?: No ?Does the patient have difficulty dressing or bathing?: No ?Independently performs ADLs?: Yes (appropriate for developmental age) ?Does the patient have difficulty walking or climbing stairs?: No ?Weakness of Legs: Both (weak ankles, frequent numbness in feet) ?Weakness of Arms/Hands: None ? ?Permission Sought/Granted ?  ?  ?   ?   ?   ?   ? ?Emotional Assessment ?  ?  ?  ?  ?  ?  ? ?Admission diagnosis:  Acute appendicitis with localized peritonitis [K35.30] ?Acute appendicitis, unspecified acute appendicitis type [K35.80] ?Patient Active Problem List  ? Diagnosis Date Noted  ? Acute appendicitis with localized peritonitis 04/10/2022  ? Hyperlipidemia 02/21/2022  ? Joint pain 02/09/2021  ? Osteoporosis 12/12/2019  ? Vitamin B 12 deficiency 12/12/2019  ? Allergic rhinitis 02/19/2019  ? Fatigue 12/06/2018  ? Moderate episode of recurrent major depressive disorder ([place])  12/06/2018  ? Preventative health care 10/10/2017  ? ?PCP:  Pleas Koch, NP ?Pharmacy:   ?[id number] [place]?Phone: [phone]: [phone]? ?[id number] [place]?Phone: [phone]: [phone]? ? ? ? ?Social Determinants of Health (SDOH) Interventions ?  ? ?Readmission Risk Interventions ?   ? View : No data to display.  ?  ?  ?  ? ? ? ?

## 2022-04-11 NOTE — Discharge Instructions (Signed)

## 2022-04-11 NOTE — Plan of Care (Signed)

## 2022-04-11 NOTE — Progress Notes (Signed)
Blood pressure 101/64, pulse 70, temperature 98.3 ?F (36.8 ?C), resp. rate 18, height '5\' 5"'$  (1.651 m), weight 49.9 kg, SpO2 95 %. ?Iv removed sites c/d/I. Pt d/c with all belongings and d/c packet d/c transported via w/c out to private car.  ?

## 2022-04-13 NOTE — Anesthesia Postprocedure Evaluation (Signed)
Anesthesia Post Note ? ?Patient: WILBUR LABUDA ? ?Procedure(s) Performed: XI ROBOTIC LAPAROSCOPIC ASSISTED APPENDECTOMY ? ?Patient location during evaluation: PACU ?Anesthesia Type: General ?Level of consciousness: awake and alert ?Pain management: pain level controlled ?Vital Signs Assessment: post-procedure vital signs reviewed and stable ?Respiratory status: spontaneous breathing, nonlabored ventilation, respiratory function stable and patient connected to nasal cannula oxygen ?Cardiovascular status: blood pressure returned to baseline and stable ?Postop Assessment: no apparent nausea or vomiting ?Anesthetic complications: no ? ? ?No notable events documented. ? ? ?Last Vitals:  ?Vitals:  ? 04/11/22 0507 04/11/22 0745  ?BP: 98/60 101/64  ?Pulse: 65 70  ?Resp: 16 18  ?Temp: 36.7 ?C 36.8 ?C  ?SpO2: 97% 95%  ?  ?Last Pain:  ?Vitals:  ? 04/11/22 0928  ?TempSrc:   ?PainSc: 3   ? ? ?  ?  ?  ?  ?  ?  ? ?Martha Clan ? ? ? ? ?

## 2022-04-19 LAB — SURGICAL PATHOLOGY

## 2022-06-28 ENCOUNTER — Encounter (HOSPITAL_BASED_OUTPATIENT_CLINIC_OR_DEPARTMENT_OTHER): Payer: Self-pay | Admitting: Emergency Medicine

## 2022-06-28 ENCOUNTER — Emergency Department (HOSPITAL_BASED_OUTPATIENT_CLINIC_OR_DEPARTMENT_OTHER): Payer: Medicare HMO

## 2022-06-28 ENCOUNTER — Other Ambulatory Visit (HOSPITAL_BASED_OUTPATIENT_CLINIC_OR_DEPARTMENT_OTHER): Payer: Self-pay

## 2022-06-28 ENCOUNTER — Emergency Department (HOSPITAL_BASED_OUTPATIENT_CLINIC_OR_DEPARTMENT_OTHER)
Admission: EM | Admit: 2022-06-28 | Discharge: 2022-06-28 | Disposition: A | Discharge status: Home / Self Care | Payer: Medicare HMO | Attending: Emergency Medicine | Admitting: Emergency Medicine

## 2022-06-28 ENCOUNTER — Other Ambulatory Visit: Payer: Self-pay

## 2022-06-28 DIAGNOSIS — M25552 Pain in left hip: Secondary | ICD-10-CM | POA: Diagnosis not present

## 2022-06-28 DIAGNOSIS — Y92009 Unspecified place in unspecified non-institutional (private) residence as the place of occurrence of the external cause: Secondary | ICD-10-CM | POA: Insufficient documentation

## 2022-06-28 DIAGNOSIS — S7002XA Contusion of left hip, initial encounter: Secondary | ICD-10-CM | POA: Insufficient documentation

## 2022-06-28 DIAGNOSIS — W19XXXA Unspecified fall, initial encounter: Secondary | ICD-10-CM | POA: Diagnosis not present

## 2022-06-28 DIAGNOSIS — S79912A Unspecified injury of left hip, initial encounter: Secondary | ICD-10-CM | POA: Diagnosis not present

## 2022-06-28 MED ORDER — TIZANIDINE HCL 2 MG PO TABS
2.0000 mg | ORAL_TABLET | Freq: Three times a day (TID) | ORAL | 0 refills | Status: DC | PRN
  Filled 2022-06-28: qty 15, 5d supply, fill #0

## 2022-06-28 NOTE — Discharge Instructions (Addendum)
Recommend continued ice and Tylenol.  Recommend 1000 mg of Tylenol every 6 hours as needed for pain.  I have prescribed you a muscle relaxant, this medication is sedating so do not use while doing dangerous activities including driving.  Follow-up with primary care doctor for further pain management if not improving.

## 2022-06-28 NOTE — ED Provider Notes (Signed)
[facility] EMERGENCY DEPARTMENT Provider Note   CSN: [id number] Arrival date & time: 06/28/22  1309     History  Chief Complaint  Patient presents with   Hip Pain    ELLANA KAWA is a 71 y.o. female.  Left hip pain since a fall about a week and a half ago.  She has a large bruise to her left hip.  She has been able to ambulate with a walker.  Walking makes it worse.  Rest makes it better.  She has been doing some over-the-counter medications with some improvement.  Denies any back pain.  No loss of bowel or bladder.  Did not hit her head or lose consciousness.  Not on a blood thinner.  No significant medical history.  Denies any chest pain, shortness of breath, weakness, numbness.  The history is provided by the patient.       Home Medications Prior to Admission medications   Medication Sig Start Date End Date Taking? Authorizing Provider  tiZANidine (ZANAFLEX) 2 MG tablet Take 1 tablet (2 mg total) by mouth every 8 (eight) hours as needed for up to 15 doses for muscle spasms. 06/28/22  Yes Rossy Virag, DO  alendronate (FOSAMAX) 70 MG tablet TAKE 1 TABLET BY MOUTH WEEKLY ON AN EMPTY STOMACH WITH WATER ONLY. NO FOOD FOR 30 MINUTES AND DO NOT LAY DOWN FOR ONE HOURS AFTER 02/26/22   Pleas Koch, NP  FLUoxetine (PROZAC) 40 MG capsule Take 1 capsule (40 mg total) by mouth daily. For depression 02/21/22   Pleas Koch, NP      Allergies    Patient has no known allergies.    Review of Systems   Review of Systems  Physical Exam Updated Vital Signs BP 131/78 (BP Location: Left Arm)   Pulse 76   Temp 98 F (36.7 C) (Oral)   Resp 20   Ht '5\' 5"'$  (1.651 m)   Wt 49.9 kg   SpO2 96%   BMI 18.30 kg/m  Physical Exam Vitals and nursing note reviewed.  Constitutional:      General: She is not in acute distress.    Appearance: She is well-developed.  HENT:     Head: Normocephalic and atraumatic.     Nose: Nose normal.     Mouth/Throat:     Mouth: Mucous  membranes are moist.  Eyes:     Extraocular Movements: Extraocular movements intact.     Conjunctiva/sclera: Conjunctivae normal.     Pupils: Pupils are equal, round, and reactive to light.  Cardiovascular:     Rate and Rhythm: Normal rate and regular rhythm.     Pulses: Normal pulses.     Heart sounds: Normal heart sounds. No murmur heard. Pulmonary:     Effort: Pulmonary effort is normal. No respiratory distress.     Breath sounds: Normal breath sounds.  Abdominal:     Palpations: Abdomen is soft.     Tenderness: There is no abdominal tenderness.  Musculoskeletal:        General: Tenderness present. No swelling. Normal range of motion.     Cervical back: Neck supple.     Comments: Tenderness and bruising to the left greater trochanter  Skin:    General: Skin is warm and dry.     Capillary Refill: Capillary refill takes less than 2 seconds.  Neurological:     General: No focal deficit present.     Mental Status: She is alert.     Sensory:  No sensory deficit.     Motor: No weakness.  Psychiatric:        Mood and Affect: Mood normal.     ED Results / Procedures / Treatments   Labs (all labs ordered are listed, but only abnormal results are displayed) Labs Reviewed - No data to display  EKG None  Radiology DG Hip Unilat With Pelvis 2-3 Views Left  Result Date: 06/28/2022 CLINICAL DATA:  Fall, LEFT hip pain EXAM: DG HIP (WITH OR WITHOUT PELVIS) 2-3V LEFT COMPARISON:  None Available. FINDINGS: Hips are located. No evidence of pelvic fracture or sacral fracture. Dedicated view of the LEFT hip demonstrates no femoral neck fracture. IMPRESSION: No fracture or dislocation. Electronically Signed   By: Suzy Bouchard M.D.   On: 06/28/2022 14:03    Procedures Procedures    Medications Ordered in ED Medications - No data to display  ED Course/ Medical Decision Making/ A&P                           Medical Decision Making Amount and/or Complexity of Data  Reviewed Radiology: ordered.  Risk Prescription drug management.   AIBHLINN KALMAR is here with left hip pain.  Fall about a week and a half ago.  Not on blood thinners.  Did not hit her head or lose consciousness.  Normal vitals.  No fever.  Pain and bruising to the left hip.  Has been ambulating with a walker since.  She is got tenderness and bruising over the left greater trochanter.  X-ray per my review and interpretation shows no fracture of the left hip or femur.  She is neurovascular neuromuscular intact.  Suspect contusion/muscle spasm.  Will prescribe Zanaflex.  Recommend ice and Tylenol.  Recommend continued ambulation with walker.  Recommend follow-up with primary care doctor for further pain management if needed.  Discharged in good condition.  This chart was dictated using voice recognition software.  Despite best efforts to proofread,  errors can occur which can change the documentation meaning.         Final Clinical Impression(s) / ED Diagnoses Final diagnoses:  Contusion of left hip, initial encounter    Rx / DC Orders ED Discharge Orders          Ordered    tiZANidine (ZANAFLEX) 2 MG tablet  Every 8 hours PRN        06/28/22 1426              Jenefer Woerner, DO 06/28/22 1429

## 2022-06-28 NOTE — ED Triage Notes (Signed)
Pt reports mechanical fall at home 1.5 weeks ago. States her pain has not improved since fall. Reports pain in left groin/hip that radiates down left leg. Also reports BL lower leg swelling x 2 days. States she did hit her head. Denies blood thinners, loc, neck pain, back pain.

## 2022-07-07 ENCOUNTER — Ambulatory Visit (INDEPENDENT_AMBULATORY_CARE_PROVIDER_SITE_OTHER): Payer: Medicare HMO | Admitting: Primary Care

## 2022-07-07 ENCOUNTER — Encounter: Payer: Self-pay | Admitting: Primary Care

## 2022-07-07 DIAGNOSIS — G5702 Lesion of sciatic nerve, left lower limb: Secondary | ICD-10-CM

## 2022-07-07 HISTORY — DX: Lesion of sciatic nerve, left lower limb: G57.02

## 2022-07-07 MED ORDER — KETOROLAC TROMETHAMINE 60 MG/2ML IM SOLN
60.0000 mg | Freq: Once | INTRAMUSCULAR | Status: AC
  Administered 2022-07-07: 60 mg via INTRAMUSCULAR

## 2022-07-07 MED ORDER — PREDNISONE 20 MG PO TABS: ORAL_TABLET | ORAL | 0 refills | Status: DC

## 2022-07-07 NOTE — Addendum Note (Signed)
Addended by: Francella Solian on: 07/07/2022 03:56 PM   Modules accepted: Orders

## 2022-07-07 NOTE — Progress Notes (Signed)
Subjective:    Patient ID: Ann Cordova, female    DOB: 1951-11-12, 71 y.o.   MRN: [id number]  Back Pain Associated symptoms include numbness. Pertinent negatives include no dysuria or weakness.  Urinary Frequency  Pertinent negatives include no flank pain, frequency or hematuria.    Ann Cordova is a very pleasant 72 y.o. female with a history of acute appendicitis, fatigue, joint pain who presents today for ED follow-up.  She presented to [place] on 06/28/2022 for acute left hip pain since a fall that occurred 10 days prior.  She denied hitting her head or losing consciousness.  She had been ambulating with a walker since her fall.  During her ED visit she underwent x-ray of the hip which was negative for fracture of the hip or femur.  She was prescribed tizanidine 4 muscle spasm and contusion.  She was told to follow-up with PCP soon.  Since her ED visit she continues to notice pain but it is located to the left buttocks with radiation to her left groin and down her left lower posterior extremity to the upper calf. She does have some numbness to the left lower extremity which has improved. Also with bilateral pedal edema. She endorses that she did hit her head when she fell, fell forward. She denies headaches, dizziness.   She continues to use her walker which is the only way she can walk. She's been staying with her sister. She's mostly sedentary during the day, has noticed improvement in her pedal edema after elevating her legs.   She denies loss of bowel/bladder control, lower extremity weakness, urinary symptoms.    Review of Systems  Genitourinary:  Negative for dysuria, flank pain, frequency, hematuria and vaginal discharge.  Musculoskeletal:  Positive for back pain.  Neurological:  Positive for numbness. Negative for weakness.         History reviewed. No pertinent past medical history.  Social History   Socioeconomic History   Marital status:  Married    Spouse name: Not on file   Number of children: Not on file   Years of education: Not on file   Highest education level: Not on file  Occupational History   Not on file  Tobacco Use   Smoking status: Former   Smokeless tobacco: Never  Vaping Use   Vaping Use: Never used  Substance and Sexual Activity   Alcohol use: No   Drug use: Not Currently   Sexual activity: Not on file  Other Topics Concern   Not on file  Social History Narrative   Married.   2 children, 6 grand children.   Retired. 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    Social Determinants of Health   Financial Resource Strain: Low Risk  (02/07/2022)   Overall Financial Resource Strain (CARDIA)    Difficulty of Paying Living Expenses: Not hard at all  Food Insecurity: No Food Insecurity (02/07/2022)   Hunger Vital Sign    Worried About Running Out of Food in the Last Year: Never true    Ran Out of Food in the Last Year: Never true  Transportation Needs: No Transportation Needs (02/07/2022)   PRAPARE - Hydrologist (Medical): No    Lack of Transportation (Non-Medical): No  Physical Activity: Sufficiently Active (02/07/2022)   Exercise Vital Sign    Days of Exercise per Week: 5 days    Minutes of Exercise per Session:  150+ min  Stress: No Stress Concern Present (02/07/2022)   [place]    Feeling of Stress : Only a little  Social Connections: Moderately Integrated (02/07/2022)   Social Connection and Isolation Panel [NHANES]    Frequency of Communication with Friends and Family: More than three times a week    Frequency of Social Gatherings with Friends and Family: More than three times a week    Attends Religious Services: More than 4 times per year    Active Member of [employer] or Organizations: No    Attends Archivist Meetings: Never    Marital Status: Married  Human resources officer  Violence: Not At Risk (02/07/2022)   Humiliation, Afraid, Rape, and Kick questionnaire    Fear of Current or Ex-Partner: No    Emotionally Abused: No    Physically Abused: No    Sexually Abused: No    Past Surgical History:  Procedure Laterality Date   COLONOSCOPY WITH PROPOFOL N/A 03/21/2022   Procedure: COLONOSCOPY WITH PROPOFOL;  Surgeon: Jonathon Bellows, MD;  Location: [facility] ENDOSCOPY;  Service: Gastroenterology;  Laterality: N/A;   XI ROBOTIC LAPAROSCOPIC ASSISTED APPENDECTOMY N/A 04/10/2022   Procedure: XI ROBOTIC LAPAROSCOPIC ASSISTED APPENDECTOMY;  Surgeon: Herbert Pun, MD;  Location: [facility] ORS;  Service: General;  Laterality: N/A;    History reviewed. No pertinent family history.  No Known Allergies  Current Outpatient Medications on File Prior to Visit  Medication Sig Dispense Refill   alendronate (FOSAMAX) 70 MG tablet TAKE 1 TABLET BY MOUTH WEEKLY ON AN EMPTY STOMACH WITH WATER ONLY. NO FOOD FOR 30 MINUTES AND DO NOT LAY DOWN FOR ONE HOURS AFTER 4 tablet 0   FLUoxetine (PROZAC) 40 MG capsule Take 1 capsule (40 mg total) by mouth daily. For depression 90 capsule 3   tiZANidine (ZANAFLEX) 2 MG tablet Take 1 tablet (2 mg total) by mouth every 8 (eight) hours as needed for up to 15 doses for muscle spasms. 15 tablet 0   No current facility-administered medications on file prior to visit.    BP 110/64   Pulse 72   Temp 98.6 F (37 C) (Oral)   Ht '5\' 5"'$  (1.651 m)   Wt 112 lb (50.8 kg)   SpO2 94%   BMI 18.64 kg/m  Objective:   Physical Exam Constitutional:      General: She is in acute distress.  Musculoskeletal:     Thoracic back: Normal range of motion.     Lumbar back: Normal range of motion.     Right hip: Normal range of motion.     Left hip: Normal range of motion.       Legs:     Comments: Pulling sensation to left buttocks while lying supine with left leg across right lower extremity           Assessment & Plan:   Problem List Items Addressed  This Visit       Nervous and Auditory   Piriformis syndrome, left    Symptoms and presentation today consistent for piriformis syndrome. No alarm signs on exam.  ED notes and imaging reviewed.   Discussed the importance of stretching the piriformis muscle, exercises shown today. IM Toradol 60 mg provided today. Continue Tizanidine 4 mg PRN.  Start prednisone course 40 mg daily x 4 days, then 20 mg daily x 4 days. She will start tomorrow.  Follow up PRN. Consider PT if no improvement.  Relevant Medications   predniSONE (DELTASONE) 20 MG tablet       Pleas Koch, NP

## 2022-07-07 NOTE — Assessment & Plan Note (Addendum)
Symptoms and presentation today consistent for piriformis syndrome. No alarm signs on exam.  ED notes and imaging reviewed.   Discussed the importance of stretching the piriformis muscle, exercises shown today. IM Toradol 60 mg provided today. Continue Tizanidine 4 mg PRN.  Start prednisone course 40 mg daily x 4 days, then 20 mg daily x 4 days. She will start tomorrow.  Follow up PRN. Consider PT if no improvement.

## 2022-07-07 NOTE — Patient Instructions (Addendum)
Start prednisone tablets. Take two tablets my mouth once daily for four days, then one tablet once daily for four days.   Continue the Tizanidine muscle relaxer at bedtime.  Start stretching as discussed.  It was a pleasure to see you today!  Piriformis Syndrome Rehab Ask your health care provider which exercises are safe for you. Do exercises exactly as told by your health care provider and adjust them as directed. It is normal to feel mild stretching, pulling, tightness, or discomfort as you do these exercises. Stop right away if you feel sudden pain or your pain gets worse. Do not begin these exercises until told by your health care provider. Stretching and range-of-motion exercises These exercises warm up your muscles and joints and improve the movement and flexibility of your hip and pelvis. The exercises also help to relieve pain, numbness, and tingling. Nerve root  Sit on a firm surface that is high enough that you can swing your left / right foot freely. Place a folded towel under your left / right thigh. This is optional. Drop your head forward and round your back. While you keep your left / right foot relaxed, slowly straighten your left / right knee until you feel a slight pull behind your knee or calf. If your leg is fully extended and you still do not feel a pull, slowly tilt your foot and toes toward you. Hold this position for __________ seconds. Slowly return your knee to its starting position. Hip rotation This is an exercise in which you lie on your back and stretch the muscles that rotate your hip (hip rotators) to stretch your buttocks. Lie on your back on a firm surface. Pull your left / right knee toward your same shoulder with your left / right hand until your knee is pointing toward the ceiling. Hold your left / right ankle with your other hand. Keeping your knee steady, gently pull your left / right ankle toward your other shoulder until you feel a stretch in your  buttocks. Hold this position for __________ seconds. Repeat __________ times. Complete this exercise __________ times a day. Hip extensor This is an exercise in which you lie on your back and pull your knee to your chest. Lie on your back on a firm surface. Both of your legs should be straight. Pull your left / right knee to your chest. Hold your leg in this position by holding on to the back of your thigh or the front of your knee. Hold this position for __________ seconds. Slowly return to the starting position. Repeat __________ times. Complete this exercise __________ times a day. Strengthening exercises These exercises build strength and endurance in your hip and thigh muscles. Endurance is the ability to use your muscles for a long time, even after they get tired. Straight leg raises, side-lying This exercise strengthens the muscles that rotate the leg at the hip and move it away from your body (hip abductors). Lie on your side with your left / right leg in the top position. Lie so your head, shoulder, knee, and hip line up. Bend your bottom knee to help you balance. Lift your top leg 4-6 inches (10-15 cm) while keeping your toes pointed straight ahead. Hold this position for __________ seconds. Slowly lower your leg to the starting position. Let your muscles relax completely after each repetition. Repeat __________ times. Complete this exercise __________ times a day. Hip abduction and rotation This is sometimes called quadruped (on hands and knees) exercises. Get  on your hands and knees on a firm, lightly padded surface. Your hands should be directly below your shoulders, and your knees should be directly below your hips. Lift your left / right knee out to the side. Keep your knee bent. Do not twist your body. Hold this position for __________ seconds. Slowly lower your leg. Repeat __________ times. Complete this exercise __________ times a day. Straight leg raises, prone This  exercise stretches the muscles that move the hips (hip extensors). Lie on your abdomen on a firm surface (prone position). Tense the muscles in your buttocks and lift your left / right leg about 4 inches (10 cm). Keep your knee straight as you lift your leg. If you cannot lift your leg that high without arching your back, place a pillow under your hips. Hold this position for __________ seconds. Slowly lower your leg to the starting position. Let your muscles relax completely after each repetition. Repeat __________ times. Complete this exercise __________ times a day. This information is not intended to replace advice given to you by your health care provider. Make sure you discuss any questions you have with your health care provider. Document Revised: 06/14/2021 Document Reviewed: 06/14/2021 Elsevier Patient Education  [place].

## 2022-07-19 ENCOUNTER — Telehealth: Payer: Self-pay | Admitting: Primary Care

## 2022-07-19 DIAGNOSIS — G5702 Lesion of sciatic nerve, left lower limb: Secondary | ICD-10-CM

## 2022-07-19 NOTE — Telephone Encounter (Signed)
I recommend we get patient in with physical therapy as soon as possible.  [place] or [place]?  We can try another round of the prednisone if that helped some.  I can also refill her muscle relaxer.    Let me know if she is okay with this plan.

## 2022-07-19 NOTE — Telephone Encounter (Signed)
Patient called and stated that she need some medicine for pain. She is out of the predniSONE (DELTASONE) 20 MG tablet. Also stated it is hard for her to walk. Call back number [phone].

## 2022-07-19 NOTE — Telephone Encounter (Signed)
Spoke to patient by telephone and was advised that she has finished the Prednisone. Patient stated the Prednisone helped some. Patient stated that she is out of the Tizanidine. Patient stated that she has been taking Ibuprofen for the pain which is not helping much. Patient stated that her pain level today has been around a 7. Patient stated that she is still having the pain on her backside that moves around to her groin area. Patient stated that it is hard to sit. Patient requested something for the pain. Pharmacy [facility]

## 2022-07-20 NOTE — Telephone Encounter (Signed)
Voice mail not set up.  Not able to leave message.

## 2022-07-21 MED ORDER — TIZANIDINE HCL 2 MG PO TABS: 2.0000 mg | ORAL_TABLET | Freq: Three times a day (TID) | ORAL | 0 refills | Status: DC | PRN

## 2022-07-21 MED ORDER — PREDNISONE 20 MG PO TABS: ORAL_TABLET | ORAL | 0 refills | Status: DC

## 2022-07-21 NOTE — Telephone Encounter (Signed)
Patient called to return a phone call. Call back number [phone].

## 2022-07-21 NOTE — Telephone Encounter (Signed)
Refills provided  Referral placed

## 2022-07-21 NOTE — Telephone Encounter (Signed)
Patient notified as instructed by telephone and verbalized understanding. Patient stated that she is okay with the plan. Patient stated that she is some better but feels like refills on medications will help. Patient stated that she prefers [place]. Patient was advised that she will hear back from one of the referral coordinators.

## 2022-07-21 NOTE — Addendum Note (Signed)
Addended by: Lesleigh Noe on: 07/21/2022 02:28 PM   Modules accepted: Orders

## 2022-07-21 NOTE — Telephone Encounter (Signed)
Noted and appreciate the assistance.  °

## 2022-08-15 ENCOUNTER — Other Ambulatory Visit: Payer: Self-pay | Admitting: Primary Care

## 2022-08-15 DIAGNOSIS — E785 Hyperlipidemia, unspecified: Secondary | ICD-10-CM

## 2022-08-29 ENCOUNTER — Other Ambulatory Visit: Payer: Medicare HMO

## 2022-10-22 IMAGING — CT CT ABD-PELV W/ CM
2 of 5 series · 16 of 46 positions shown, 18 images · IV contrast (APPLIED)
Comparison: CT abdomen pelvis 08/23/2007

CLINICAL DATA: RLQ abdominal pain (Age >= 14y)

EXAM:
CT ABDOMEN AND PELVIS WITH CONTRAST
TECHNIQUE: Multidetector CT imaging of the abdomen and pelvis was performed
using the standard protocol following bolus administration of
intravenous contrast.

[Series 2: axial st · axial · 0.68mm/px · z∈[-796,-471]mm · 13 of 75 slices shown, 15 images]
[im 5/75  soft-tissue]
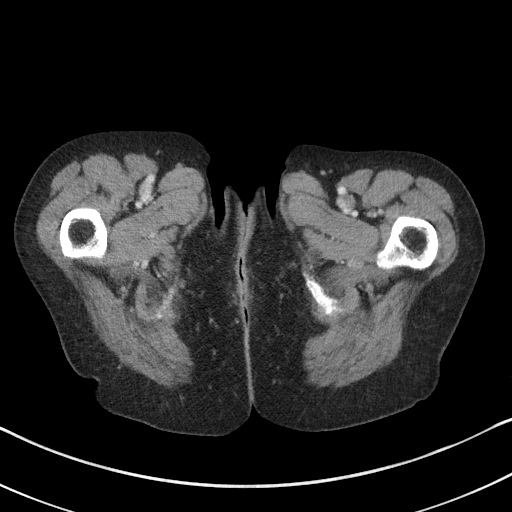
[im 5/75  bone]
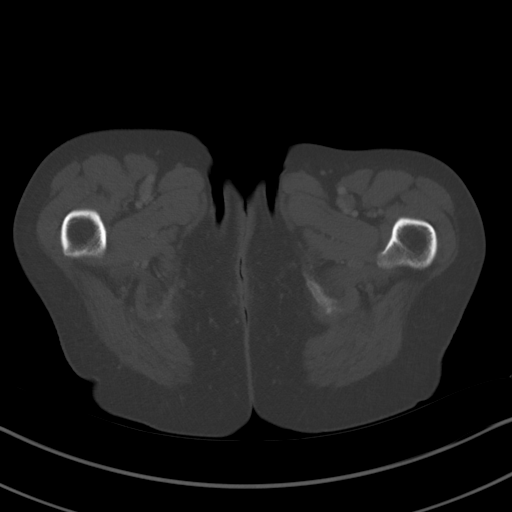
[im 9/75  soft-tissue]
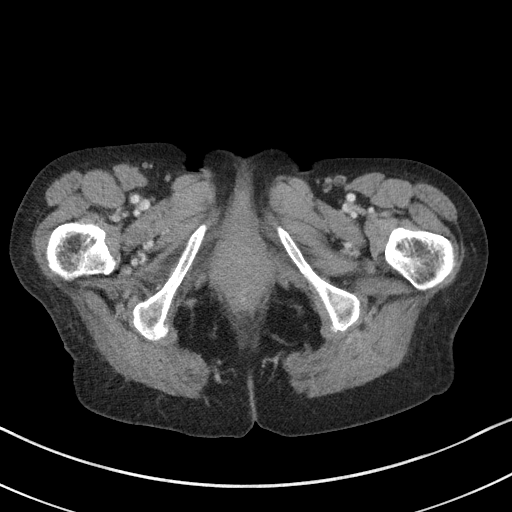
[im 18/75  soft-tissue]
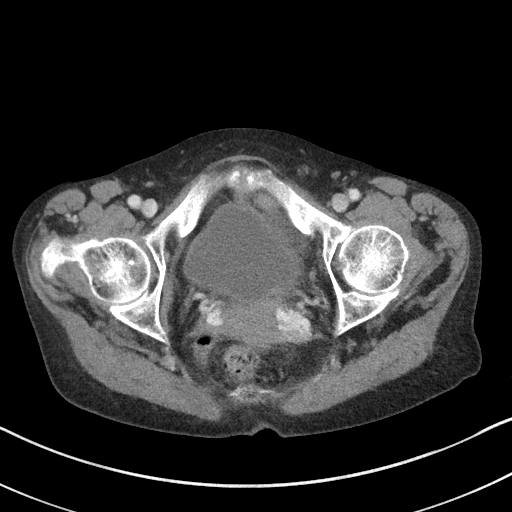
[im 22/75  soft-tissue]
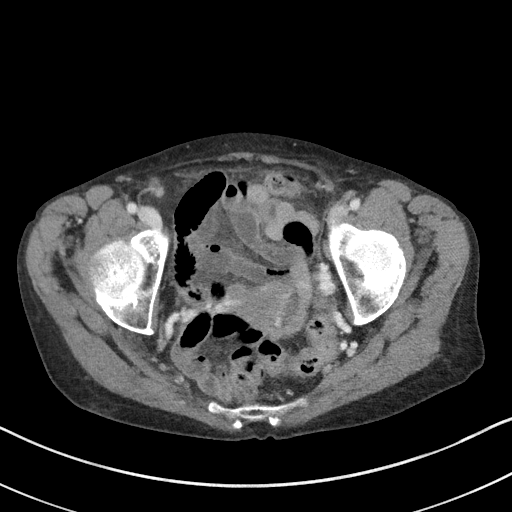
[im 27/75  soft-tissue]
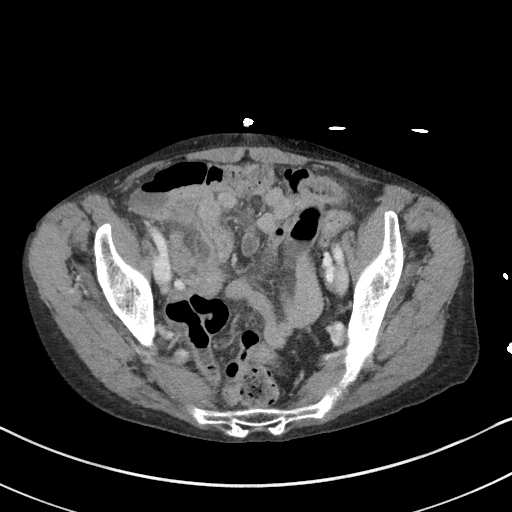
[im 31/75  soft-tissue]
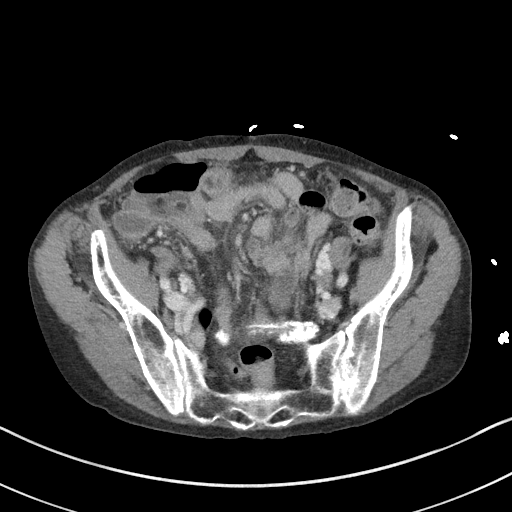
[im 40/75  soft-tissue]
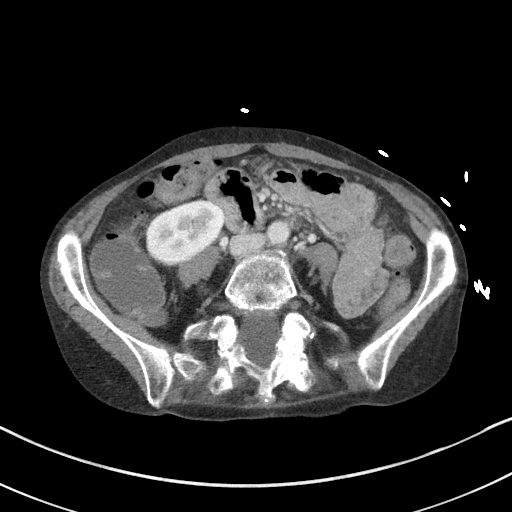
[im 44/75  soft-tissue]
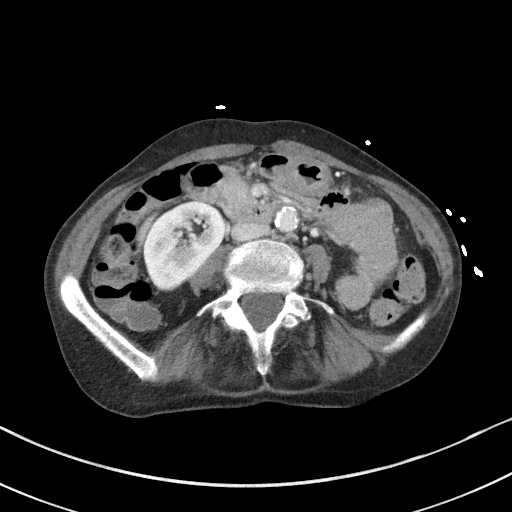
[im 48/75  soft-tissue]
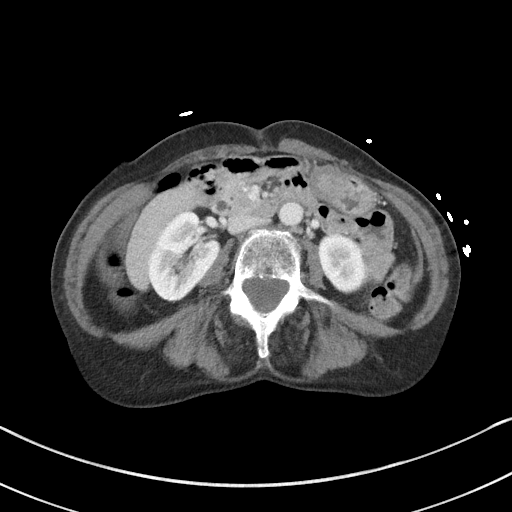
[im 48/75  bone]
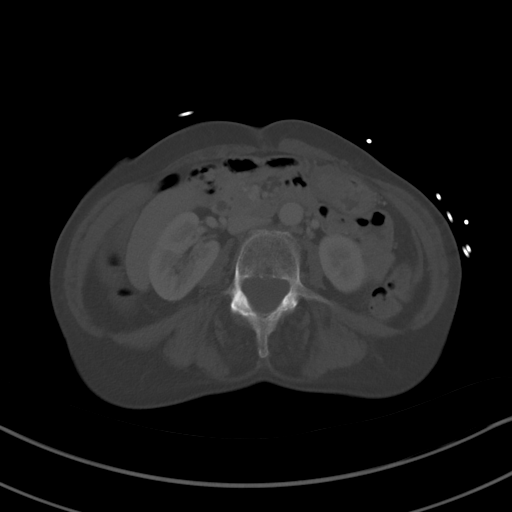
[im 53/75  soft-tissue]
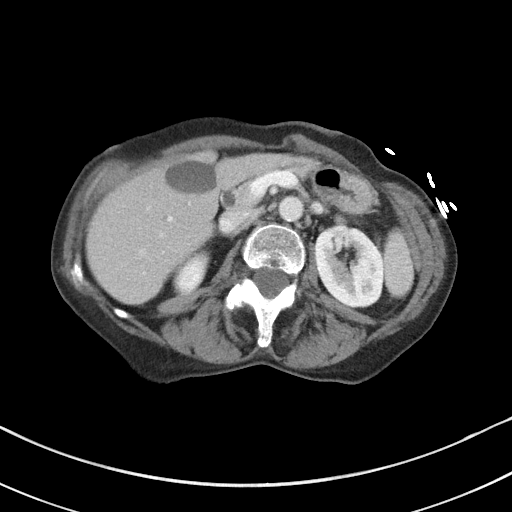
[im 57/75  soft-tissue]
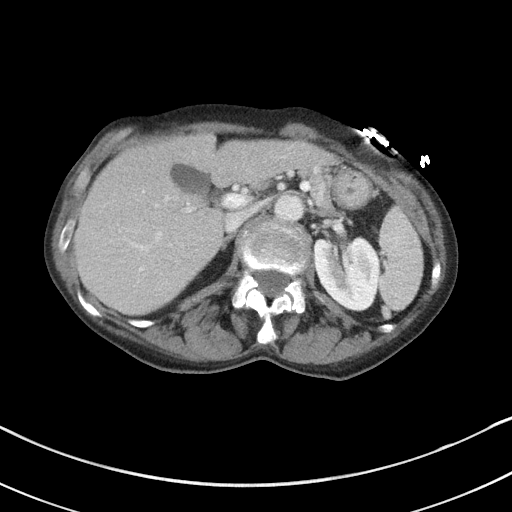
[im 66/75  soft-tissue]
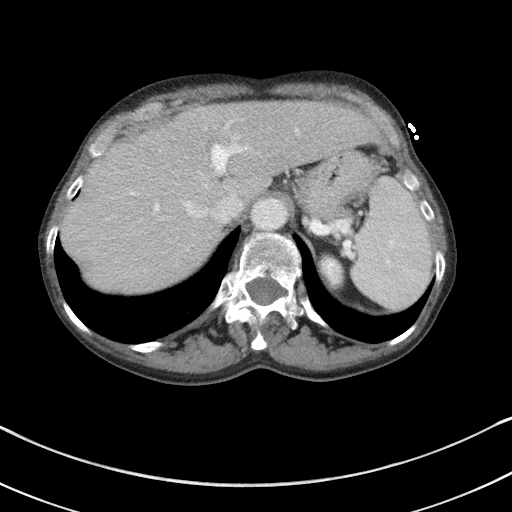
[im 70/75  soft-tissue]
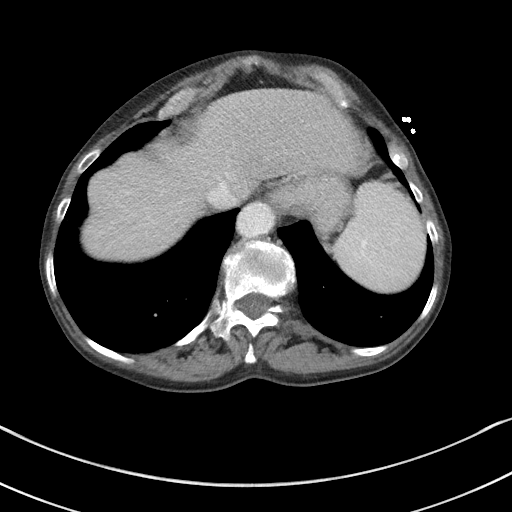

[Series 5: coronal st · coronal · 0.64mm/px · 3 of 93 slices shown]
[im 31/93  soft-tissue]
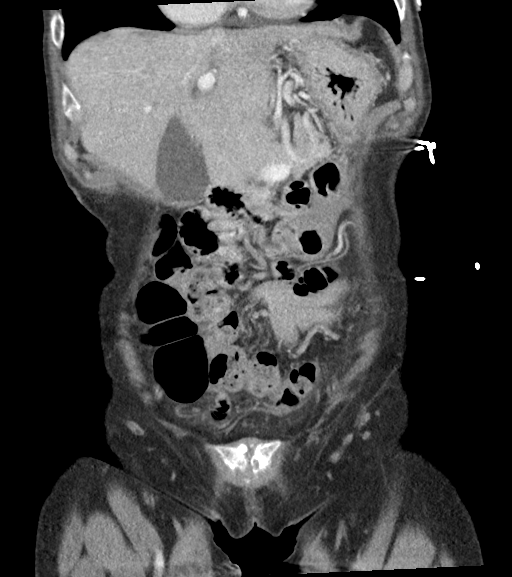
[im 41/93  soft-tissue]
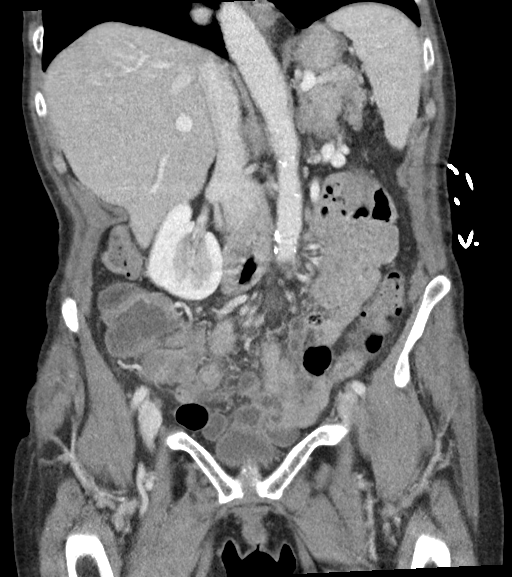
[im 52/93  soft-tissue]
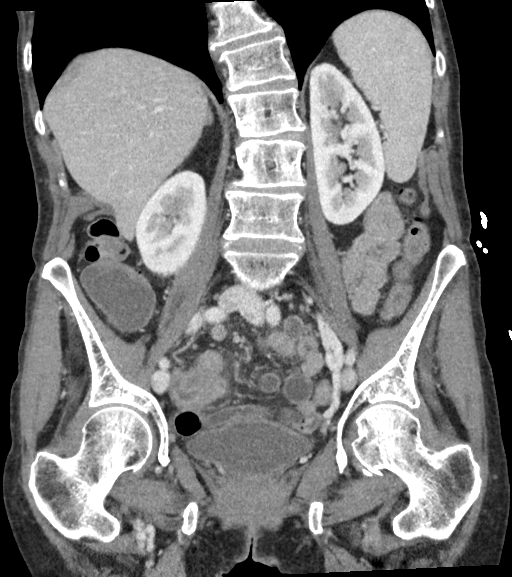

[16 of 46 positions shown; findings below may reference images not displayed]

RADIATION DOSE REDUCTION: This exam was performed according to the
departmental dose-optimization program which includes automated
exposure control, adjustment of the mA and/or kV according to
patient size and/or use of iterative reconstruction technique.

CONTRAST:  100mL OMNIPAQUE IOHEXOL 300 MG/ML  SOLN
FINDINGS: Lower chest: No acute abnormality.

Hepatobiliary: No focal liver abnormality is seen. The gallbladder
is unremarkable.

Pancreas: Unremarkable. No pancreatic ductal dilatation or
surrounding inflammatory changes.

Spleen: Normal in size without focal abnormality.

Adrenals/Urinary Tract: Adrenal glands are unremarkable. No
hydronephrosis or nephrolithiasis. The bladder is mildly distended.

Stomach/Bowel: Probable tiny hiatal hernia. No evidence of bowel
obstruction.There is a retrocecal tubular structure favored to be
the appendix which appears enlarged measuring up to 9 mm in diameter
(coronal images 43-46). This is in close proximity to loops of
distal ileum, which demonstrate submucosal edema. The terminal ileum
appears normal. There is adjacent inflammatory stranding. There is
no focal/drainable fluid collection.

Vascular/Lymphatic: Aortoiliac atherosclerotic calcifications. No
AAA. No lymphadenopathy.

Reproductive: Unremarkable.

Other: No abdominal wall hernia or abnormality. No abdominopelvic
ascites.

Musculoskeletal: No acute osseous abnormality. No suspicious lytic
or blastic lesions. There are bilateral L5 pars defects with grade
[DATE] anterolisthesis at L5-S1. Minimal bilateral hip arthritis.
IMPRESSION: Retrocecal tubular structure favored to be the appendix which is
enlarged measuring up to 9 mm in diameter. This is in close
proximity to loops of distal ileum which demonstrate submucosal
edema. There is adjacent inflammatory stranding in the right lower
quadrant. Findings are favored to represent acute appendicitis with
adjacent reactive ileitis, however the inverse is also a possibility
(distal ileitis with reactive prominence of the appendix). There is
no focal/drainable fluid collection.

## 2023-02-08 ENCOUNTER — Telehealth: Payer: Self-pay | Admitting: *Deleted

## 2023-02-08 ENCOUNTER — Ambulatory Visit (INDEPENDENT_AMBULATORY_CARE_PROVIDER_SITE_OTHER): Payer: Medicare Other

## 2023-02-08 VITALS — Ht 65.0 in | Wt 112.0 lb

## 2023-02-08 DIAGNOSIS — Z9189 Other specified personal risk factors, not elsewhere classified: Secondary | ICD-10-CM | POA: Diagnosis not present

## 2023-02-08 DIAGNOSIS — Z5986 Financial insecurity: Secondary | ICD-10-CM | POA: Diagnosis not present

## 2023-02-08 DIAGNOSIS — Z0000 Encounter for general adult medical examination without abnormal findings: Secondary | ICD-10-CM | POA: Diagnosis not present

## 2023-02-08 DIAGNOSIS — Z780 Asymptomatic menopausal state: Secondary | ICD-10-CM

## 2023-02-08 DIAGNOSIS — Z1231 Encounter for screening mammogram for malignant neoplasm of breast: Secondary | ICD-10-CM

## 2023-02-08 NOTE — Patient Instructions (Signed)
Ann Cordova , Thank you for taking time to come for your Medicare Wellness Visit. I appreciate your ongoing commitment to your health goals. Please review the following plan we discussed and let me know if I can assist you in the future.   These are the goals we discussed:  Goals      Patient Stated     Starting 12/02/2018,  I will continue to keep all scheduled appointments with my PCP.     Patient Stated     02/04/2021, I will maintain and continue medications as prescribed.      Patient Stated     Would like to drink more water.     Patient Stated     Be more more active.        This is a list of the screening recommended for you and due dates:  Health Maintenance  Topic Date Due   DTaP/Tdap/Td vaccine (1 - Tdap) Never done   Mammogram  02/05/2020   Flu Shot  07/25/2022   COVID-19 Vaccine (1) 02/20/2023*   Medicare Annual Wellness Visit  02/09/2024   Colon Cancer Screening  03/21/2025   Pneumonia Vaccine  Completed   DEXA scan (bone density measurement)  Completed   Hepatitis C Screening: USPSTF Recommendation to screen - Ages 30-79 yo.  Completed   HPV Vaccine  Aged Out   Zoster (Shingles) Vaccine  Discontinued  *Topic was postponed. The date shown is not the original due date.    Advanced directives: Advance directive discussed with you today. Even though you declined this today, please call our office should you change your mind, and we can give you the proper paperwork for you to fill out.   Conditions/risks identified: Aim for 30 minutes of exercise or brisk walking, 6-8 glasses of water, and 5 servings of fruits and vegetables each day.  Each day, aim for 6 glasses of water, plenty of protein in your diet and try to get up and walk/ stretch every hour for 5-10 minutes at a time.    Vaccinations: declines all Influenza vaccine: recommend every Fall Pneumococcal vaccine: recommend once per lifetime Prevnar-20 Tdap vaccine: recommend every 10 years Shingles vaccine:  recommend Shingrix which is 2 doses 2-6 months apart and over 90% effective     Covid-19: recommend 2 doses one month apart with a booster 6 months later  .  Next appointment: Follow up in one year for your annual wellness visit 02/13/2024 @ 8:00 via telephone.   Preventive Care 8 Years and Older, Female Preventive care refers to lifestyle choices and visits with your health care provider that can promote health and wellness. What does preventive care include? A yearly physical exam. This is also called an annual well check. Dental exams once or twice a year. Routine eye exams. Ask your health care provider how often you should have your eyes checked. Personal lifestyle choices, including: Daily care of your teeth and gums. Regular physical activity. Eating a healthy diet. Avoiding tobacco and drug use. Limiting alcohol use. Practicing safe sex. Taking low-dose aspirin every day. Taking vitamin and mineral supplements as recommended by your health care provider. What happens during an annual well check? The services and screenings done by your health care provider during your annual well check will depend on your age, overall health, lifestyle risk factors, and family history of disease. Counseling  Your health care provider may ask you questions about your: Alcohol use. Tobacco use. Drug use. Emotional well-being. Home and relationship  well-being. Sexual activity. Eating habits. History of falls. Memory and ability to understand (cognition). Work and work Statistician. Reproductive health. Screening  You may have the following tests or measurements: Height, weight, and BMI. Blood pressure. Lipid and cholesterol levels. These may be checked every 5 years, or more frequently if you are over 92 years old. Skin check. Lung cancer screening. You may have this screening every year starting at age 62 if you have a 30-pack-year history of smoking and currently smoke or have quit  within the past 15 years. Fecal occult blood test (FOBT) of the stool. You may have this test every year starting at age 39. Flexible sigmoidoscopy or colonoscopy. You may have a sigmoidoscopy every 5 years or a colonoscopy every 10 years starting at age 30. Hepatitis C blood test. Hepatitis B blood test. Sexually transmitted disease (STD) testing. Diabetes screening. This is done by checking your blood sugar (glucose) after you have not eaten for a while (fasting). You may have this done every 1-3 years. Bone density scan. This is done to screen for osteoporosis. You may have this done starting at age 46. Mammogram. This may be done every 1-2 years. Talk to your health care provider about how often you should have regular mammograms. Talk with your health care provider about your test results, treatment options, and if necessary, the need for more tests. Vaccines  Your health care provider may recommend certain vaccines, such as: Influenza vaccine. This is recommended every year. Tetanus, diphtheria, and acellular pertussis (Tdap, Td) vaccine. You may need a Td booster every 10 years. Zoster vaccine. You may need this after age 79. Pneumococcal 13-valent conjugate (PCV13) vaccine. One dose is recommended after age 20. Pneumococcal polysaccharide (PPSV23) vaccine. One dose is recommended after age 26. Talk to your health care provider about which screenings and vaccines you need and how often you need them. This information is not intended to replace advice given to you by your health care provider. Make sure you discuss any questions you have with your health care provider. Document Released: 01/07/2016 Document Revised: 08/30/2016 Document Reviewed: 10/12/2015 Elsevier Interactive Patient Education  2017 [place] Prevention in the Home Falls can cause injuries. They can happen to people of all ages. There are many things you can do to make your home safe and to help prevent  falls. What can I do on the outside of my home? Regularly fix the edges of walkways and driveways and fix any cracks. Remove anything that might make you trip as you walk through a door, such as a raised step or threshold. Trim any bushes or trees on the path to your home. Use bright outdoor lighting. Clear any walking paths of anything that might make someone trip, such as rocks or tools. Regularly check to see if handrails are loose or broken. Make sure that both sides of any steps have handrails. Any raised decks and porches should have guardrails on the edges. Have any leaves, snow, or ice cleared regularly. Use sand or salt on walking paths during winter. Clean up any spills in your garage right away. This includes oil or grease spills. What can I do in the bathroom? Use night lights. Install grab bars by the toilet and in the tub and shower. Do not use towel bars as grab bars. Use non-skid mats or decals in the tub or shower. If you need to sit down in the shower, use a plastic, non-slip stool. Keep the floor dry. Clean  up any water that spills on the floor as soon as it happens. Remove soap buildup in the tub or shower regularly. Attach bath mats securely with double-sided non-slip rug tape. Do not have throw rugs and other things on the floor that can make you trip. What can I do in the bedroom? Use night lights. Make sure that you have a light by your bed that is easy to reach. Do not use any sheets or blankets that are too big for your bed. They should not hang down onto the floor. Have a firm chair that has side arms. You can use this for support while you get dressed. Do not have throw rugs and other things on the floor that can make you trip. What can I do in the kitchen? Clean up any spills right away. Avoid walking on wet floors. Keep items that you use a lot in easy-to-reach places. If you need to reach something above you, use a strong step stool that has a grab  bar. Keep electrical cords out of the way. Do not use floor polish or wax that makes floors slippery. If you must use wax, use non-skid floor wax. Do not have throw rugs and other things on the floor that can make you trip. What can I do with my stairs? Do not leave any items on the stairs. Make sure that there are handrails on both sides of the stairs and use them. Fix handrails that are broken or loose. Make sure that handrails are as long as the stairways. Check any carpeting to make sure that it is firmly attached to the stairs. Fix any carpet that is loose or worn. Avoid having throw rugs at the top or bottom of the stairs. If you do have throw rugs, attach them to the floor with carpet tape. Make sure that you have a light switch at the top of the stairs and the bottom of the stairs. If you do not have them, ask someone to add them for you. What else can I do to help prevent falls? Wear shoes that: Do not have high heels. Have rubber bottoms. Are comfortable and fit you well. Are closed at the toe. Do not wear sandals. If you use a stepladder: Make sure that it is fully opened. Do not climb a closed stepladder. Make sure that both sides of the stepladder are locked into place. Ask someone to hold it for you, if possible. Clearly mark and make sure that you can see: Any grab bars or handrails. First and last steps. Where the edge of each step is. Use tools that help you move around (mobility aids) if they are needed. These include: Canes. Walkers. Scooters. Crutches. Turn on the lights when you go into a dark area. Replace any light bulbs as soon as they burn out. Set up your furniture so you have a clear path. Avoid moving your furniture around. If any of your floors are uneven, fix them. If there are any pets around you, be aware of where they are. Review your medicines with your doctor. Some medicines can make you feel dizzy. This can increase your chance of falling. Ask  your doctor what other things that you can do to help prevent falls. This information is not intended to replace advice given to you by your health care provider. Make sure you discuss any questions you have with your health care provider. Document Released: 10/07/2009 Document Revised: 05/18/2016 Document Reviewed: 01/15/2015 Elsevier Interactive Patient Education  2017 [place].

## 2023-02-08 NOTE — Progress Notes (Signed)
I connected with  PAGE DANIELSKI on 02/08/23 by a audio enabled telemedicine application and verified that I am speaking with the correct person using two identifiers.  Patient Location: Home  Provider Location: Office/Clinic  I discussed the limitations of evaluation and management by telemedicine. The patient expressed understanding and agreed to proceed.  Subjective:   ZYAIRAH NIKOLICH is a 72 y.o. female who presents for Medicare Annual (Subsequent) preventive examination.  Review of Systems     Cardiac Risk Factors include: advanced age (>61mn, >>39women);sedentary lifestyle;dyslipidemia     Objective:    Today's Vitals   02/08/23 0829  Weight: 112 lb (50.8 kg)  Height: 5' 5"$  (1.651 m)   Body mass index is 18.64 kg/m.     02/08/2023    8:45 AM 06/28/2022    1:19 PM 04/10/2022   11:47 PM 04/10/2022   12:48 PM 03/21/2022   10:14 AM 02/07/2022    9:00 AM 02/04/2021    8:14 AM  Advanced Directives  Does Patient Have a Medical Advance Directive? No No No No No No No  Would patient like information on creating a medical advance directive? No - Patient declined  No - Patient declined No - Patient declined No - Patient declined Yes (MAU/Ambulatory/Procedural Areas - Information given) No - Patient declined    Current Medications (verified) Outpatient Encounter Medications as of 02/08/2023  Medication Sig   alendronate (FOSAMAX) 70 MG tablet TAKE 1 TABLET BY MOUTH WEEKLY ON AN EMPTY STOMACH WITH WATER ONLY. NO FOOD FOR 30 MINUTES AND DO NOT LAY DOWN FOR ONE HOURS AFTER   FLUoxetine (PROZAC) 40 MG capsule Take 1 capsule (40 mg total) by mouth daily. For depression   predniSONE (DELTASONE) 20 MG tablet Take two tablets my mouth once daily for four days, then one tablet once daily for four days. (Patient not taking: Reported on 02/08/2023)   tiZANidine (ZANAFLEX) 2 MG tablet Take 1 tablet (2 mg total) by mouth every 8 (eight) hours as needed for up to 15 doses for muscle spasms.  (Patient not taking: Reported on 02/08/2023)   No facility-administered encounter medications on file as of 02/08/2023.    Allergies (verified) Patient has no known allergies.   History: History reviewed. No pertinent past medical history. Past Surgical History:  Procedure Laterality Date   COLONOSCOPY WITH PROPOFOL N/A 03/21/2022   Procedure: COLONOSCOPY WITH PROPOFOL;  Surgeon: AJonathon Bellows MD;  [facility];  Service: Gastroenterology;  Laterality: N/A;   XI ROBOTIC LAPAROSCOPIC ASSISTED APPENDECTOMY N/A 04/10/2022   Procedure: XI ROBOTIC LAPAROSCOPIC ASSISTED APPENDECTOMY;  Surgeon: CHerbert Pun MD;  Location: [facility] ORS;  Service: General;  Laterality: N/A;   History reviewed. No pertinent family history. Social History   Socioeconomic History   Marital status: Widowed    Spouse name: Not on file   Number of children: Not on file   Years of education: Not on file   Highest education level: Not on file  Occupational History   Not on file  Tobacco Use   Smoking status: Former   Smokeless tobacco: Never  Vaping Use   Vaping Use: Never used  Substance and Sexual Activity   Alcohol use: No   Drug use: Not Currently   Sexual activity: Not on file  Other Topics Concern   Not on file  Social History Narrative   Married.   2 children, 6 grand children.   Retired. Worked in rScientist, research (medical)   Takes care of her mother and husband.  Social Determinants of Health   Financial Resource Strain: Medium Risk (02/08/2023)   Overall Financial Resource Strain (CARDIA)    Difficulty of Paying Living Expenses: Somewhat hard  Food Insecurity: No Food Insecurity (02/08/2023)   Hunger Vital Sign    Worried About Running Out of Food in the Last Year: Never true    Ran Out of Food in the Last Year: Never true  Transportation Needs: No Transportation Needs (02/08/2023)   PRAPARE - Hydrologist (Medical): No    Lack of Transportation (Non-Medical): No   Physical Activity: Inactive (02/08/2023)   Exercise Vital Sign    Days of Exercise per Week: 0 days    Minutes of Exercise per Session: 0 min  Stress: Stress Concern Present (02/08/2023)   [place]    Feeling of Stress : To some extent  Social Connections: Moderately Isolated (02/08/2023)   Social Connection and Isolation Panel [NHANES]    Frequency of Communication with Friends and Family: More than three times a week    Frequency of Social Gatherings with Friends and Family: Once a week    Attends Religious Services: More than 4 times per year    Active Member of [employer] or Organizations: No    Attends Archivist Meetings: Never    Marital Status: Widowed    Tobacco Counseling Counseling given: No   Clinical Intake:  Pre-visit preparation completed: Yes  Pain : No/denies pain     Nutritional Risks: None Diabetes: No  How often do you need to have someone help you when you read instructions, pamphlets, or other written materials from your doctor or pharmacy?: 1 - Never  Diabetic?no  Interpreter Needed?: No  Information entered by :: C.Tristian Sickinger LPN   Activities of Daily Living    02/08/2023    8:48 AM 04/10/2022   11:53 PM  In your present state of health, do you have any difficulty performing the following activities:  Hearing? 0 0  Vision? 1 0  Comment Calling for [facility] for appt.   Difficulty concentrating or making decisions? 0 0  Walking or climbing stairs? 1 0  Comment Gets out of breath faster   Dressing or bathing? 0 0  Doing errands, shopping? 0   Preparing Food and eating ? N   Using the Toilet? N   In the past six months, have you accidently leaked urine? Y   Comment occasional, wears pads   Do you have problems with loss of bowel control? N   Managing your Medications? N   Managing your Finances? N   Housekeeping or managing your Housekeeping? N     Patient Care  Team: Pleas Koch, NP as PCP - General (Internal Medicine)  Indicate any recent Medical Services you may have received from other than [facility] providers in the past year (date may be approximate).     Assessment:   This is a routine wellness examination for Lamara.  Hearing/Vision screen Hearing Screening - Comments:: No aids Vision Screening - Comments:: Wears glasses - [facility] will call for appt.  Dietary issues and exercise activities discussed: Current Exercise Habits: The patient does not participate in regular exercise at present, Exercise limited by: Other - see comments;None identified (just don't feel like exercising)   Goals Addressed             This Visit's Progress    Patient Stated  Be more more active.       Depression Screen    02/08/2023    8:35 AM 07/07/2022    3:23 PM 02/21/2022    2:06 PM 02/07/2022    9:04 AM 02/04/2021    8:18 AM 01/09/2019   10:48 AM 12/06/2018    3:56 PM  PHQ 2/9 Scores  PHQ - 2 Score 0 0 1 0 0 1 4  PHQ- 9 Score  0 7  0 10 12    Fall Risk    02/08/2023    8:46 AM 02/07/2022    9:01 AM 02/09/2021    1:59 PM 02/04/2021    8:17 AM 12/02/2018   11:54 AM  Fall Risk   Falls in the past year? 1 1 0 0 0  Comment  tripped     Number falls in past yr: 0 0 0 0   Injury with Fall? 1 0 0 0   Comment Siatic nerve pain      Risk for fall due to : Other (Comment) No Fall Risks  No Fall Risks   Risk for fall due to: Comment Tripped over shoe      Follow up Falls evaluation completed;Falls prevention discussed;Education provided Falls prevention discussed  Falls evaluation completed;Falls prevention discussed     FALL RISK PREVENTION PERTAINING TO THE HOME:  Any stairs in or around the home? No  If so, are there any without handrails? No  Home free of loose throw rugs in walkways, pet beds, electrical cords, etc? Yes  Adequate lighting in your home to reduce risk of falls? Yes   ASSISTIVE DEVICES UTILIZED TO  PREVENT FALLS:  Life alert? No  Use of a cane, walker or w/c? No  Grab bars in the bathroom? Yes  Shower chair or bench in shower? No  Elevated toilet seat or a handicapped toilet? Yes   Cognitive Function:    02/04/2021    8:22 AM 12/02/2018    1:00 PM  MMSE - Mini Mental State Exam  Orientation to time 5 5  Orientation to Place 5 5  Registration 3 3  Attention/ Calculation 5 0  Recall 3 3  Language- name 2 objects  0  Language- repeat 1 1  Language- follow 3 step command  3  Language- read & follow direction  0  Write a sentence  0  Copy design  0  Total score  20        02/08/2023    9:01 AM  6CIT Screen  What Year? 0 points  What month? 0 points  What time? 0 points  Count back from 20 0 points  Months in reverse 0 points  Repeat phrase 0 points  Total Score 0 points    Immunizations Immunization History  Administered Date(s) Administered   Influenza,inj,Quad PF,6+ Mos 10/10/2017, 12/02/2018   Pneumococcal Conjugate-13 10/10/2017   Pneumococcal Polysaccharide-23 12/02/2018    TDAP status: Due, Education has been provided regarding the importance of this vaccine. Advised may receive this vaccine at local pharmacy or Health Dept. Aware to provide a copy of the vaccination record if obtained from local pharmacy or Health Dept. Verbalized acceptance and understanding.  Flu Vaccine status: Declined, Education has been provided regarding the importance of this vaccine but patient still declined. Advised may receive this vaccine at local pharmacy or Health Dept. Aware to provide a copy of the vaccination record if obtained from local pharmacy or Health Dept. Verbalized acceptance and understanding.  Pneumococcal vaccine  status: Up to date  Covid-19 vaccine status: Declined, Education has been provided regarding the importance of this vaccine but patient still declined. Advised may receive this vaccine at local pharmacy or Health Dept.or vaccine clinic. Aware to  provide a copy of the vaccination record if obtained from local pharmacy or Health Dept. Verbalized acceptance and understanding.  Qualifies for Shingles Vaccine? Yes   Zostavax completed No   Shingrix Completed?: No.    Education has been provided regarding the importance of this vaccine. Patient has been advised to call insurance company to determine out of pocket expense if they have not yet received this vaccine. Advised may also receive vaccine at local pharmacy or Health Dept. Verbalized acceptance and understanding.  Screening Tests Health Maintenance  Topic Date Due   DTaP/Tdap/Td (1 - Tdap) Never done   MAMMOGRAM  02/05/2020   INFLUENZA VACCINE  07/25/2022   COVID-19 Vaccine (1) 02/20/2023 (Originally 09/02/1951)   Medicare Annual Wellness (AWV)  02/09/2024   COLONOSCOPY (Pts 45-37yr Insurance coverage will need to be confirmed)  03/21/2025   Pneumonia Vaccine 72 Years old  Completed   DEXA SCAN  Completed   Hepatitis C Screening  Completed   HPV VACCINES  Aged Out   Zoster Vaccines- Shingrix  Discontinued    Health Maintenance  Health Maintenance Due  Topic Date Due   DTaP/Tdap/Td (1 - Tdap) Never done   MAMMOGRAM  02/05/2020   INFLUENZA VACCINE  07/25/2022    Colorectal cancer screening: Type of screening: Colonoscopy. Completed 03/21/2022. Repeat every 3 years  Mammogram status: Ordered today. Pt provided with contact info and advised to call to schedule appt.   Bone Density status: Completed 02/04/2019. Results reflect: Bone density results: OSTEOPOROSIS. Repeat every 2 years. Order placed today.  Lung Cancer Screening: (Low Dose CT Chest recommended if Age 71-80years, 30 pack-year currently smoking OR have quit w/in 15years.) does not qualify.   Lung Cancer Screening Referral: none  Additional Screening:  Hepatitis C Screening: does not qualify; Completed 12/02/1018  Vision Screening: Recommended annual ophthalmology exams for early detection of glaucoma  and other disorders of the eye. Is the patient up to date with their annual eye exam?  No  Who is the provider or what is the name of the office in which the patient attends annual eye exams? [facility] If pt is not established with a provider, would they like to be referred to a provider to establish care? No .   Dental Screening: Recommended annual dental exams for proper oral hygiene  Community Resource Referral / Chronic Care Management: CRR required this visit?  Yes   CCM required this visit?  No      Plan:     I have personally reviewed and noted the following in the patient's chart:   Medical and social history Use of alcohol, tobacco or illicit drugs  Current medications and supplements including opioid prescriptions. Patient is not currently taking opioid prescriptions. Functional ability and status Nutritional status Physical activity Advanced directives List of other physicians Hospitalizations, surgeries, and ER visits in previous 12 months Vitals Screenings to include cognitive, depression, and falls Referrals and appointments  In addition, I have reviewed and discussed with patient certain preventive protocols, quality metrics, and best practice recommendations. A written personalized care plan for preventive services as well as general preventive health recommendations were provided to patient.     CLebron Conners LPN   [id number]  Nurse Notes: Referral placed today for  Mammogram and dexa scan at pt's request.. Community referral placed for assistance with financial insecurities and sedentary lifestyle. Vaccinations: declines all Influenza vaccine: recommend every Fall Pneumococcal vaccine: recommend once per lifetime Prevnar-20 Tdap vaccine: recommend every 10 years Shingles vaccine: recommend Shingrix which is 2 doses 2-6 months apart and over 90% effective     Covid-19: recommend 2 doses one month apart with a booster 6 months later

## 2023-02-08 NOTE — Telephone Encounter (Signed)
   Telephone encounter was:  Unsuccessful.  02/08/2023 Name: Ann Cordova MRN: [id number] DOB: [birth year]  Unsuccessful outbound call made today to assist with:  Food Insecurity  Outreach Attempt:  1st Attempt No voicemail set up   [facility] [address] , [place] [address] Email : [email or web address]

## 2023-02-09 ENCOUNTER — Telehealth: Payer: Self-pay | Admitting: *Deleted

## 2023-02-13 NOTE — Telephone Encounter (Signed)
   Telephone encounter was:  Successful.  02/13/2023 Name: Ann Cordova MRN: [id number] DOB: [birth year]  Ann Cordova is a 72 y.o. year old female who is a primary care patient of Pleas Koch, NP . The community resource team was consulted for assistance with Financial Difficulties related to utilities  Care guide performed the following interventions: Patient provided with information about care guide support team and interviewed to confirm resource needs. Will mail a property tax discount paperwork also medicaid application for xtra help and LIEAP application as well  Follow Up Plan:  No further follow up planned at this time. The patient has been provided with needed resources.  ,[name] [phone] [address] , [place] [address] Email : [email or web address]

## 2023-02-18 ENCOUNTER — Other Ambulatory Visit: Payer: Self-pay | Admitting: Primary Care

## 2023-02-18 DIAGNOSIS — F331 Major depressive disorder, recurrent, moderate: Secondary | ICD-10-CM

## 2023-02-18 NOTE — Telephone Encounter (Signed)
Patient is due for CPE/follow up, this will be required prior to any further refills.  Please schedule, thank you!

## 2023-02-19 NOTE — Telephone Encounter (Signed)
Patient has been scheduled

## 2023-03-14 DIAGNOSIS — H5213 Myopia, bilateral: Secondary | ICD-10-CM | POA: Diagnosis not present

## 2023-03-14 DIAGNOSIS — H52223 Regular astigmatism, bilateral: Secondary | ICD-10-CM | POA: Diagnosis not present

## 2023-03-14 DIAGNOSIS — H524 Presbyopia: Secondary | ICD-10-CM | POA: Diagnosis not present

## 2023-03-14 DIAGNOSIS — H2513 Age-related nuclear cataract, bilateral: Secondary | ICD-10-CM | POA: Diagnosis not present

## 2023-03-20 ENCOUNTER — Ambulatory Visit (INDEPENDENT_AMBULATORY_CARE_PROVIDER_SITE_OTHER): Payer: Medicare Other | Admitting: Primary Care

## 2023-03-20 ENCOUNTER — Encounter: Payer: Self-pay | Admitting: Primary Care

## 2023-03-20 ENCOUNTER — Other Ambulatory Visit: Payer: Self-pay | Admitting: Primary Care

## 2023-03-20 VITALS — BP 134/76 | HR 67 | Temp 98.5°F | Ht 65.0 in | Wt 111.0 lb

## 2023-03-20 DIAGNOSIS — M810 Age-related osteoporosis without current pathological fracture: Secondary | ICD-10-CM | POA: Diagnosis not present

## 2023-03-20 DIAGNOSIS — E538 Deficiency of other specified B group vitamins: Secondary | ICD-10-CM

## 2023-03-20 DIAGNOSIS — Z0000 Encounter for general adult medical examination without abnormal findings: Secondary | ICD-10-CM

## 2023-03-20 DIAGNOSIS — R202 Paresthesia of skin: Secondary | ICD-10-CM | POA: Diagnosis not present

## 2023-03-20 DIAGNOSIS — Z0001 Encounter for general adult medical examination with abnormal findings: Secondary | ICD-10-CM

## 2023-03-20 DIAGNOSIS — E785 Hyperlipidemia, unspecified: Secondary | ICD-10-CM | POA: Diagnosis not present

## 2023-03-20 DIAGNOSIS — F331 Major depressive disorder, recurrent, moderate: Secondary | ICD-10-CM | POA: Diagnosis not present

## 2023-03-20 DIAGNOSIS — E2839 Other primary ovarian failure: Secondary | ICD-10-CM

## 2023-03-20 DIAGNOSIS — Z1231 Encounter for screening mammogram for malignant neoplasm of breast: Secondary | ICD-10-CM | POA: Diagnosis not present

## 2023-03-20 LAB — LIPID PANEL
Cholesterol: 237 mg/dL — ABNORMAL HIGH (ref 0–200)
HDL: 68 mg/dL (ref >39.00)
LDL Cholesterol: 150 mg/dL — ABNORMAL HIGH (ref 0–99)
NonHDL: 168.5
Total CHOL/HDL Ratio: 3
Triglycerides: 91 mg/dL (ref 0.0–149.0)
VLDL: 18.2 mg/dL (ref 0.0–40.0)

## 2023-03-20 LAB — CBC
HCT: 39.8 % (ref 36.0–46.0)
Hemoglobin: 13.5 g/dL (ref 12.0–15.0)
MCHC: 33.9 g/dL (ref 30.0–36.0)
MCV: 90.9 fl (ref 78.0–100.0)
Platelets: 379 10*3/uL (ref 150.0–400.0)
RBC: 4.38 Mil/uL (ref 3.87–5.11)
RDW: 13.3 % (ref 11.5–15.5)
WBC: 4.3 10*3/uL (ref 4.0–10.5)

## 2023-03-20 LAB — COMPREHENSIVE METABOLIC PANEL
ALT: 11 U/L (ref 0–35)
AST: 18 U/L (ref 0–37)
Albumin: 4.4 g/dL (ref 3.5–5.2)
Alkaline Phosphatase: 69 U/L (ref 39–117)
BUN: 8 mg/dL (ref 6–23)
CO2: 27 mEq/L (ref 19–32)
Calcium: 9.6 mg/dL (ref 8.4–10.5)
Chloride: 103 mEq/L (ref 96–112)
Creatinine, Ser: 0.68 mg/dL (ref 0.40–1.20)
GFR: 87.85 mL/min (ref >60.00)
Glucose, Bld: 98 mg/dL (ref 70–99)
Potassium: 4 mEq/L (ref 3.5–5.1)
Sodium: 138 mEq/L (ref 135–145)
Total Bilirubin: 0.5 mg/dL (ref 0.2–1.2)
Total Protein: 7 g/dL (ref 6.0–8.3)

## 2023-03-20 LAB — VITAMIN B12: Vitamin B-12: 405 pg/mL (ref 211–911)

## 2023-03-20 NOTE — Progress Notes (Signed)
Subjective:    Patient ID: Ann Cordova, female    DOB: [birth year], 72 y.o.   MRN: [id number]  HPI  Ann Cordova is a very pleasant 72 y.o. female who presents today for complete physical and follow up of chronic conditions.  She would also like to discuss paresthesias and pain. Chronic to bilateral dorsal and plantar feet for years. Gradually increased over the years. She has a family history of neuropathy in her mother. She continues to work part time, works three to four days weekly, 5-8 hour shifts.   Immunizations: -Influenza: Completed this season -Shingrix: Declines -Pneumonia: Completed Prevnar 13 in 2018 and Pneumovax 2023  Diet: Elliott.  Exercise: Some walking   Eye exam: Completes annually  Dental exam: Completes semi-annually    Mammogram: 2020 Bone Density Scan: 2020  Colonoscopy: Completed in 2023, due 2026   BP Readings from Last 3 Encounters:  03/20/23 134/76  07/07/22 110/64  06/28/22 131/78      Review of Systems  Constitutional:  Negative for unexpected weight change.  HENT:  Negative for rhinorrhea.   Respiratory:  Negative for cough and shortness of breath.   Cardiovascular:  Negative for chest pain.  Gastrointestinal:  Negative for constipation and diarrhea.  Genitourinary:  Negative for difficulty urinating.  Musculoskeletal:  Negative for arthralgias and myalgias.  Skin:  Negative for rash.  Allergic/Immunologic: Negative for environmental allergies.  Neurological:  Positive for numbness. Negative for dizziness and headaches.  Psychiatric/Behavioral:  The patient is not nervous/anxious.          Past Medical History:  Diagnosis Date   Acute appendicitis with localized peritonitis 04/10/2022    Social History   Socioeconomic History   Marital status: Widowed    Spouse name: Not on file   Number of children: Not on file   Years of education: Not on file   Highest education level: Not on file  Occupational History   Not  on file  Tobacco Use   Smoking status: Former   Smokeless tobacco: Never  Vaping Use   Vaping Use: Never used  Substance and Sexual Activity   Alcohol use: No   Drug use: Not Currently   Sexual activity: Not on file  Other Topics Concern   Not on file  Social History Narrative   Married.   2 children, 6 grand children.   Retired. 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    Social Determinants of Health   Financial Resource Strain: Medium Risk (02/08/2023)   Overall Financial Resource Strain (CARDIA)    Difficulty of Paying Living Expenses: Somewhat hard  Food Insecurity: No Food Insecurity (02/08/2023)   Hunger Vital Sign    Worried About Running Out of Food in the Last Year: Never true    Ran Out of Food in the Last Year: Never true  Transportation Needs: No Transportation Needs (02/08/2023)   PRAPARE - Hydrologist (Medical): No    Lack of Transportation (Non-Medical): No  Physical Activity: Inactive (02/08/2023)   Exercise Vital Sign    Days of Exercise per Week: 0 days    Minutes of Exercise per Session: 0 min  Stress: Stress Concern Present (02/08/2023)   [place]    Feeling of Stress : To some extent  Social Connections: Moderately Isolated (02/08/2023)   Social Connection and Isolation Panel [NHANES]    Frequency of Communication with  Friends and Family: More than three times a week    Frequency of Social Gatherings with Friends and Family: Once a week    Attends Religious Services: More than 4 times per year    Active Member of [employer] or Organizations: No    Attends Archivist Meetings: Never    Marital Status: Widowed  Intimate Partner Violence: Not At Risk (02/08/2023)   Humiliation, Afraid, Rape, and Kick questionnaire    Fear of Current or Ex-Partner: No    Emotionally Abused: No    Physically Abused: No    Sexually Abused: No    Past  Surgical History:  Procedure Laterality Date   COLONOSCOPY WITH PROPOFOL N/A 03/21/2022   Procedure: COLONOSCOPY WITH PROPOFOL;  Surgeon: Jonathon Bellows, MD;  Location: [facility] ENDOSCOPY;  Service: Gastroenterology;  Laterality: N/A;   XI ROBOTIC LAPAROSCOPIC ASSISTED APPENDECTOMY N/A 04/10/2022   Procedure: XI ROBOTIC LAPAROSCOPIC ASSISTED APPENDECTOMY;  Surgeon: Herbert Pun, MD;  Location: [facility] ORS;  Service: General;  Laterality: N/A;    History reviewed. No pertinent family history.  No Known Allergies  Current Outpatient Medications on File Prior to Visit  Medication Sig Dispense Refill   alendronate (FOSAMAX) 70 MG tablet TAKE 1 TABLET BY MOUTH WEEKLY ON AN EMPTY STOMACH WITH WATER ONLY. NO FOOD FOR 30 MINUTES AND DO NOT LAY DOWN FOR ONE HOURS AFTER 4 tablet 0   FLUoxetine (PROZAC) 40 MG capsule TAKE 1 CAPSULE (40 MG TOTAL) BY MOUTH DAILY. FOR DEPRESSION 30 capsule 0   predniSONE (DELTASONE) 20 MG tablet Take two tablets my mouth once daily for four days, then one tablet once daily for four days. (Patient not taking: Reported on 02/08/2023) 12 tablet 0   tiZANidine (ZANAFLEX) 2 MG tablet Take 1 tablet (2 mg total) by mouth every 8 (eight) hours as needed for up to 15 doses for muscle spasms. (Patient not taking: Reported on 02/08/2023) 15 tablet 0   No current facility-administered medications on file prior to visit.    BP 134/76   Pulse 67   Temp 98.5 F (36.9 C) (Temporal)   Ht 5\' 5"  (1.651 m)   Wt 111 lb (50.3 kg)   SpO2 99%   BMI 18.47 kg/m  Objective:   Physical Exam HENT:     Right Ear: Tympanic membrane and ear canal normal.     Left Ear: Tympanic membrane and ear canal normal.     Nose: Nose normal.  Eyes:     Conjunctiva/sclera: Conjunctivae normal.     Pupils: Pupils are equal, round, and reactive to light.  Neck:     Thyroid: No thyromegaly.  Cardiovascular:     Rate and Rhythm: Normal rate and regular rhythm.     Heart sounds: No murmur heard. Pulmonary:      Effort: Pulmonary effort is normal.     Breath sounds: Normal breath sounds. No rales.  Abdominal:     General: Bowel sounds are normal.     Palpations: Abdomen is soft.     Tenderness: There is no abdominal tenderness.  Musculoskeletal:        General: Normal range of motion.     Cervical back: Neck supple.  Lymphadenopathy:     Cervical: No cervical adenopathy.  Skin:    General: Skin is warm and dry.     Findings: No rash.  Neurological:     Mental Status: She is alert and oriented to person, place, and time.     Cranial Nerves: No  cranial nerve deficit.     Deep Tendon Reflexes: Reflexes are normal and symmetric.  Psychiatric:        Mood and Affect: Mood normal.           Assessment & Plan:  Encounter for annual general medical examination with abnormal findings in adult Assessment & Plan: Declines shingrix. Bone density scan due, orders placed. Mammogram due, orders placed. Colonoscopy UTD, due 2026  Discussed the importance of a healthy diet and regular exercise in order for weight loss, and to reduce the risk of further co-morbidity.  Exam stable. Labs pending.  Follow up in 1 year for repeat physical.    Osteoporosis, unspecified osteoporosis type, unspecified pathological fracture presence Assessment & Plan: Bone density scan overdue, she agrees to have done.  Bone density scan ordered and pending. Continue alendronate 70 mg weekly.   Hyperlipidemia, unspecified hyperlipidemia type Assessment & Plan: Repeat lipid panel pending.  Discussed the importance of a healthy diet and regular exercise in order for weight loss, and to reduce the risk of further co-morbidity.   Orders: -     Lipid panel -     Comprehensive metabolic panel  Moderate episode of recurrent major depressive disorder ([place]) Assessment & Plan: Controlled.  Continue fluoxetine 40 mg daily.    Vitamin B 12 deficiency Assessment & Plan: Repeat B12 level pending,  especially in the setting of paresthesias.   Continue 1000 mcg daily.   Orders: -     Vitamin B12  Paresthesias Assessment & Plan: To bilateral feet. Differentials include neuropathy vs plantar fasciitis.   Referral placed to podiatry. Labs pending.  Orders: -     Vitamin B12 -     Ambulatory referral to Podiatry -     CBC  Screening mammogram for breast cancer -     3D Screening Mammogram, Left and Right; Future  Estrogen deficiency -     DG Bone Density; Future        Pleas Koch, NP

## 2023-03-20 NOTE — Assessment & Plan Note (Addendum)
Bone density scan overdue, she agrees to have done.  Bone density scan ordered and pending. Continue alendronate 70 mg weekly.

## 2023-03-20 NOTE — Patient Instructions (Signed)
Stop by the lab prior to leaving today. I will notify you of your results once received.   You will either be contacted via phone regarding your referral to podiatry, or you may receive a letter on your MyChart portal from our referral team with instructions for scheduling an appointment. Please let us know if you have not been contacted by anyone within two weeks.  [facility] to schedule your mammogram and bone density scan.   It was a pleasure to see you today!

## 2023-03-20 NOTE — Assessment & Plan Note (Signed)
Repeat lipid panel pending. ° °Discussed the importance of a healthy diet and regular exercise in order for weight loss, and to reduce the risk of further co-morbidity. ° °

## 2023-03-20 NOTE — Assessment & Plan Note (Signed)
To bilateral feet. Differentials include neuropathy vs plantar fasciitis.   Referral placed to podiatry. Labs pending.

## 2023-03-20 NOTE — Assessment & Plan Note (Signed)
Controlled.  ?Continue fluoxetine 40 mg daily. ?

## 2023-03-20 NOTE — Assessment & Plan Note (Signed)
Declines shingrix. Bone density scan due, orders placed. Mammogram due, orders placed. Colonoscopy UTD, due 2026  Discussed the importance of a healthy diet and regular exercise in order for weight loss, and to reduce the risk of further co-morbidity.  Exam stable. Labs pending.  Follow up in 1 year for repeat physical.

## 2023-03-20 NOTE — Assessment & Plan Note (Signed)
Repeat B12 level pending, especially in the setting of paresthesias.   Continue 1000 mcg daily.

## 2023-03-22 ENCOUNTER — Other Ambulatory Visit: Payer: Self-pay | Admitting: Primary Care

## 2023-03-22 DIAGNOSIS — E785 Hyperlipidemia, unspecified: Secondary | ICD-10-CM

## 2023-04-18 ENCOUNTER — Ambulatory Visit: Payer: Medicare Other | Admitting: Podiatrist

## 2023-04-18 ENCOUNTER — Ambulatory Visit (INDEPENDENT_AMBULATORY_CARE_PROVIDER_SITE_OTHER): Payer: Medicare Other

## 2023-04-18 ENCOUNTER — Encounter: Payer: Self-pay | Admitting: Podiatrist

## 2023-04-18 ENCOUNTER — Ambulatory Visit: Payer: Medicare Other | Admitting: Podiatry

## 2023-04-18 DIAGNOSIS — M778 Other enthesopathies, not elsewhere classified: Secondary | ICD-10-CM

## 2023-04-18 DIAGNOSIS — M2142 Flat foot [pes planus] (acquired), left foot: Secondary | ICD-10-CM

## 2023-04-18 DIAGNOSIS — M2141 Flat foot [pes planus] (acquired), right foot: Secondary | ICD-10-CM

## 2023-04-18 NOTE — Progress Notes (Signed)
Chief Complaint  Patient presents with   Foot Pain    Dorsal midfoot/medial foot bilateral - aching x years, "looks like there is knots", numbness left, painful walking for long periods, did have a fall and hurt left leg, tried diclofenac gel-some help   New Patient (Initial Visit)     HPI: Patient is 72 y.o. female who presents today for discomfort on the medial aspect of both feet.  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  She has tried diclofenac gel and has noticed some improvement.  No injury reported.   Patient Active Problem List   Diagnosis Date Noted   Paresthesias 03/20/2023   Piriformis syndrome, left 07/07/2022   Hyperlipidemia 02/21/2022   Joint pain 02/09/2021   Osteoporosis 12/12/2019   Vitamin B 12 deficiency 12/12/2019   Allergic rhinitis 02/19/2019   Fatigue 12/06/2018   Moderate episode of recurrent major depressive disorder 12/06/2018   Encounter for annual general medical examination with abnormal findings in adult 10/10/2017    Current Outpatient Medications on File Prior to Visit  Medication Sig Dispense Refill   alendronate (FOSAMAX) 70 MG tablet TAKE 1 TABLET BY MOUTH WEEKLY ON AN EMPTY STOMACH WITH WATER ONLY. NO FOOD FOR 30 MINUTES AND DO NOT LAY DOWN FOR ONE HOURS AFTER 4 tablet 0   FLUoxetine (PROZAC) 40 MG capsule TAKE 1 CAPSULE (40 MG TOTAL) BY MOUTH DAILY. FOR DEPRESSION 90 capsule 3   predniSONE (DELTASONE) 20 MG tablet Take two tablets my mouth once daily for four days, then one tablet once daily for four days. (Patient not taking: Reported on 02/08/2023) 12 tablet 0   tiZANidine (ZANAFLEX) 2 MG tablet Take 1 tablet (2 mg total) by mouth every 8 (eight) hours as needed for up to 15 doses for muscle spasms. (Patient not taking: Reported on 02/08/2023) 15 tablet 0   No current facility-administered medications on file prior to visit.    No Known Allergies  Review of Systems No fevers, chills, nausea, muscle aches, no difficulty breathing,  no calf pain, no chest pain or shortness of breath.   Physical Exam  GENERAL APPEARANCE: Alert, conversant. Appropriately groomed. No acute distress.   VASCULAR: Pedal pulses palpable 2/4 DP and PT bilateral.  Capillary refill time is immediate to all digits,  Proximal to distal cooling is warm to warm.  Digital perfusion adequate.   NEUROLOGIC: sensation is intact to 5.07 monofilament at 5/5 sites bilateral.  Light touch is intact bilateral, vibratory sensation intact bilateral  MUSCULOSKELETAL: acceptable muscle strength, tone and stability bilateral.  Enlarged first metatarsal base/ medial cuneiform is noted bilateral. Generalized discomfort noted.  Flat foot type noted with too many toes sign when standing and prominent navicular bilateral. Contracture of toes noted 1-5 bilateral.   DERMATOLOGIC: skin is warm, supple, and dry.  Color, texture, and turgor of skin within normal limits.  No open wounds are noted.  No preulcerative lesions are seen.  Digital nails are thickened and dystrophic and difficult for her to trim.    Radiographic exam:  Collapsible flat foot with joint space narrowing/arthritic changes seen at the medial cuneiform- first metatarsal base. Prominent navicular noted bilateral. Contracture of lesser digits is noted bilateral. otherwise  normal osseous mineralization noted.  No fracture or dislocation or acute osseous abnormalities present.  Assessment     ICD-10-CM   1. Capsulitis of foot  M77.8 DG Foot Complete Left    DG Foot Complete Right    2. Pes planus of  both feet  M21.41    M21.42        Plan  Exam findings and treatment recommendations are discussed.  Discussed the xrays and the arthritic changes along with the flattening of the foot are leading to discomfort on this medial column.  Recommended arch supports and shoegear changes to start.  3/4 length arch supports are recommended and she relates she has some lace up sneakers at home to try.  Discussed  she could also benefit from a custom device if more support is needed.  She will call in the future for recheck if symptoms persist or fail to improve.

## 2023-04-18 NOTE — Patient Instructions (Signed)
Inserts to try are made by Dwaine Deter or [employer].  They are good at helping support your arch and relieving the tiredness of your feet.    Flat Feet, Adult  Flat feet is a common condition in which there is no curve, or arch, on the inner sides of the feet. Normally, an adult foot has an arch. The arch creates a gap between the foot and the ground. This condition can occur in one foot or in both feet. What are the causes? This condition may be caused by: An injury to tendons and ligaments in the foot, such as to the tendon that supports the arch (posterior tibial tendon). Tendons are tissues that connect muscle to bone, and ligaments are tissues that connect bones to each other. Loose tendons or ligaments in your foot. A wearing down of your arch over time. Injury to bones in your foot. An abnormality in the bones of your foot called tarsal coalition. This happens when two or more bones in the foot are joined together (fused). Tarsal coalition is often present at birth, but signs of it typically do not show until the early teen years. What increases the risk? The following factors may make you more likely to develop this condition: Being an adult age 14 or older. Having a family history of flat feet or a history of childhood flexible flatfoot. Having obesity, diabetes, or high blood pressure. Taking part in high-impact sports. Having inflammatory arthritis. Having had any of these problems with the bones in your foot: A broken bone (fracture). Bones that were moved out of place (dislocated). Achilles tendon injuries. What are the signs or symptoms? Symptoms of this condition include: Pain or tightness along the bottom of your foot. Foot pain that gets worse with activity. Swelling of the inner side of your foot or swelling of your ankle. Pain on the outer side of your ankle. Changes in gait. Your gait is the way that you walk. Pronation. This is when the foot and ankle lean inward when  you are standing. Bony bumps on the top or inner side of your foot. How is this diagnosed? This condition is diagnosed with a physical exam of your foot and ankle. Your health care provider may also: Check your shoes for patterns of wear on the soles. Order imaging tests, such as X-rays to see the bone structure. Refer you to a health care provider who specializes in feet (podiatrist). How is this treated? This condition may be treated with: Rest and ice to decrease inflammation and pain in the affected foot. Stretching or physical therapy exercises. These are done to: Improve movement and strength in your foot. Increase range of motion and relieve pain. A shoe insert (orthotic insert) for one foot or both feet. This helps to support the arch of your foot. An orthotic insert or inserts can be purchased from a store or can be custom-made. Wearing shoes with appropriate arch support. This is especially important for athletes. Medicines. These may be prescribed or injected into the affected foot to relieve pain. An ankle boot or cast or a foot or leg brace to relieve pressure on your affected foot. Surgery. This may be done to improve the alignment of your foot. Surgery is needed only if your posterior tibial tendon is torn or if you have tarsal coalition. Follow these instructions at home: Managing pain, stiffness, and swelling If directed, put ice on the painful area. To do this: If you have a removable brace or boot, remove  it as told by your health care provider. Put ice in a plastic bag. Place a towel between your skin and the bag or between your cast and the bag. Leave the ice on for 20 minutes, 2-3 times a day. Remove the ice if your skin turns bright red. This is very important. If you cannot feel pain, heat, or cold, you have a greater risk of damage to the area. Move your toes often to reduce stiffness and swelling. Raise (elevate) the injured area above the level of your heart  while you are sitting or lying down. Activity Do exercises as told by your health care provider, if they were prescribed. If an activity causes pain, avoid it or try to find another activity that does not cause pain. General instructions Wear an orthotic insert and appropriate shoes as told by your health care provider. Take over-the-counter and prescription medicines only as told by your health care provider. Wear a brace, boot, or cast as told by your health care provider. Keep all follow-up visits. This is important. How is this prevented? To prevent the condition from getting worse: Wear comfortable, supportive shoes that are appropriate for your activities. Maintain a healthy weight. Stay active in a way that your health care provider recommends. This will help to keep your feet flexible and strong. Manage long-term (chronic) health conditions, such as diabetes, high blood pressure, and inflammatory arthritis. Work with a health care provider if you have concerns about your feet or shoes. Contact a health care provider if you have: Pain in your foot or lower leg that gets worse or does not improve with medicine. Pain or trouble when walking. Problems with your orthotic insert. Summary Flat feet is a common condition in which there is no curve, or arch, on the inner sides of the feet. This condition can occur in one foot or in both feet. Your health care provider may recommend a shoe insert (orthotic insert) for one foot or both feet, or shoes with the appropriate arch support. Other treatments may include stretching or physical therapy exercises, pain medicines, and wearing a foot or leg brace or an ankle boot or cast. Surgery may be done if you have a tear in the tendon that supports your arch (posterior tibial tendon) or if two or more of your foot bones were joined together, or fused,before birth (tarsal coalition). This information is not intended to replace advice given to you by  your health care provider. Make sure you discuss any questions you have with your health care provider. Document Revised: 05/20/2020 Document Reviewed: 05/20/2020 Elsevier Patient Education  2023 [employer].

## 2023-07-23 ENCOUNTER — Encounter: Payer: Self-pay | Admitting: Nurse Practitioner

## 2023-07-23 ENCOUNTER — Ambulatory Visit (INDEPENDENT_AMBULATORY_CARE_PROVIDER_SITE_OTHER): Payer: Medicare Other | Admitting: Nurse Practitioner

## 2023-07-23 ENCOUNTER — Other Ambulatory Visit (INDEPENDENT_AMBULATORY_CARE_PROVIDER_SITE_OTHER): Payer: Medicare Other

## 2023-07-23 ENCOUNTER — Ambulatory Visit (INDEPENDENT_AMBULATORY_CARE_PROVIDER_SITE_OTHER)
Admission: RE | Admit: 2023-07-23 | Discharge: 2023-07-23 | Disposition: A | Discharge status: Home / Self Care | Payer: Medicare Other | Source: Ambulatory Visit | Attending: Nurse Practitioner | Admitting: Nurse Practitioner

## 2023-07-23 VITALS — BP 130/82 | HR 64 | Temp 98.1°F | Ht 65.0 in

## 2023-07-23 DIAGNOSIS — M25511 Pain in right shoulder: Secondary | ICD-10-CM

## 2023-07-23 DIAGNOSIS — R0789 Other chest pain: Secondary | ICD-10-CM | POA: Diagnosis not present

## 2023-07-23 DIAGNOSIS — W19XXXA Unspecified fall, initial encounter: Secondary | ICD-10-CM | POA: Diagnosis not present

## 2023-07-23 DIAGNOSIS — E785 Hyperlipidemia, unspecified: Secondary | ICD-10-CM | POA: Diagnosis not present

## 2023-07-23 DIAGNOSIS — S42021A Displaced fracture of shaft of right clavicle, initial encounter for closed fracture: Secondary | ICD-10-CM | POA: Diagnosis not present

## 2023-07-23 DIAGNOSIS — S42001A Fracture of unspecified part of right clavicle, initial encounter for closed fracture: Secondary | ICD-10-CM | POA: Diagnosis not present

## 2023-07-23 DIAGNOSIS — S2241XA Multiple fractures of ribs, right side, initial encounter for closed fracture: Secondary | ICD-10-CM | POA: Diagnosis not present

## 2023-07-23 DIAGNOSIS — S42031A Displaced fracture of lateral end of right clavicle, initial encounter for closed fracture: Secondary | ICD-10-CM | POA: Diagnosis not present

## 2023-07-23 DIAGNOSIS — M19011 Primary osteoarthritis, right shoulder: Secondary | ICD-10-CM | POA: Diagnosis not present

## 2023-07-23 HISTORY — DX: Pain in right shoulder: M25.511

## 2023-07-23 HISTORY — DX: Fracture of unspecified part of right clavicle, initial encounter for closed fracture: S42.001A

## 2023-07-23 MED ORDER — HYDROCODONE-ACETAMINOPHEN 5-325 MG PO TABS: 1.0000 | ORAL_TABLET | Freq: Three times a day (TID) | ORAL | 0 refills | Status: DC | PRN

## 2023-07-23 NOTE — Assessment & Plan Note (Signed)
After independent review of the films and consultation with Hannah Beat, MD sports medicine physician patient has a displaced clavicle fracture.  She was placed in a sling in the office and we will refer her to orthopedics for further evaluation.  Patient was also given a short course of Norco pain medication

## 2023-07-23 NOTE — Progress Notes (Signed)
Acute Office Visit  Subjective:     Patient ID: Ann Cordova, female    DOB: 21-Dec-1951, 72 y.o.   MRN: [id number]  Chief Complaint  Patient presents with   Hip Pain    Pt states of bruised Right side pain starting at shoulder going down to her sternum area from a fall. Pain level at 7 today.  Pt states it hurts to lay down and stand up. OTC ibuprofen only helps a little.    Medication Refill    Pt. States that she is out of refill on tizanidine.     HPI Patient is in today for fall with a history of osteoporosis, and piriformis syndrome   States that she fell last Sunday. She was going up some steps and she fell sideways landing on her shoulder. States she was at her daughters house and she is an ED burse. States that her sholder feels ok. Her ribs hurt. States using ibuprofen that helps some. States last does was 6an this morning. States it is hurting all the time. States that worse with movement . Hurts with deep breaths. States that she has felt short of breath when she was walking fast to the store.   Patient does mention that she still works for [employer].  States traditionally when she is not in pain that she would be hanging stuff up but as of lately worked with her and she has not been doing that type of labor for them.  Review of Systems  Constitutional:  Negative for chills and fever.  Respiratory:  Positive for shortness of breath.   Cardiovascular:  Negative for chest pain.  Musculoskeletal:  Positive for back pain, falls and joint pain.  Neurological:  Negative for loss of consciousness and headaches.        Objective:    BP 130/82   Pulse 64   Temp 98.1 F (36.7 C) (Temporal)   Ht 5\' 5"  (1.651 m)   SpO2 97%   BMI 18.47 kg/m    Physical Exam Vitals and nursing note reviewed. Exam conducted with a chaperone present Melina Copa, CMA).  Constitutional:      Appearance: Normal appearance.  Cardiovascular:     Rate and Rhythm: Normal rate and regular  rhythm.     Heart sounds: Normal heart sounds.  Pulmonary:     Effort: Pulmonary effort is normal.     Breath sounds: Normal breath sounds.  Chest:     Chest wall: Tenderness present.  Musculoskeletal:        General: Tenderness and signs of injury present.       Arms:     Comments: Non tender on clavicle or shoulder.  Clavicle moved and popped on palpation. Patient denies any pain   Neurological:     Mental Status: She is alert.     No results found for any visits on 07/23/23.      Assessment & Plan:   Problem List Items Addressed This Visit       Musculoskeletal and Integument   Closed displaced fracture of right clavicle    After independent review of the films and consultation with Hannah Beat, MD sports medicine physician patient has a displaced clavicle fracture.  She was placed in a sling in the office and we will refer her to orthopedics for further evaluation.  Patient was also given a short course of Norco pain medication      Relevant Orders   Ambulatory referral to Orthopedic Surgery  Other   Fall - Primary    Patient had a mechanical fall today as LOC denies striking her head.      Relevant Medications   HYDROcodone-acetaminophen (NORCO) 5-325 MG tablet   Other Relevant Orders   DG Shoulder Right (Completed)   DG Ribs Unilateral W/Chest Right (Completed)   Acute pain of right shoulder    Secondary to mechanical fall pending official read on x-rays Norco 3 times daily as needed.  Sedation precautions reviewed      Relevant Medications   HYDROcodone-acetaminophen (NORCO) 5-325 MG tablet   Other Relevant Orders   DG Shoulder Right (Completed)   Chest wall pain    Chest wall pain after mechanical fall.  Patient does have bruising pending right-sided chest x-ray pending official read does like patient has at least 1 nondisplaced rib fracture      Relevant Medications   HYDROcodone-acetaminophen (NORCO) 5-325 MG tablet   Other Relevant Orders    DG Ribs Unilateral W/Chest Right (Completed)    Meds ordered this encounter  Medications   HYDROcodone-acetaminophen (NORCO) 5-325 MG tablet    Sig: Take 1 tablet by mouth 3 (three) times daily as needed for up to 5 days for moderate pain.    Dispense:  15 tablet    Refill:  0    Order Specific Question:   Supervising Provider    Answer:   TOWER, MARNE A [[id number]    Return if symptoms worsen or fail to improve.  Audria Nine, NP

## 2023-07-23 NOTE — Assessment & Plan Note (Signed)
Secondary to mechanical fall pending official read on x-rays Norco 3 times daily as needed.  Sedation precautions reviewed

## 2023-07-23 NOTE — Assessment & Plan Note (Signed)
Chest wall pain after mechanical fall.  Patient does have bruising pending right-sided chest x-ray pending official read does like patient has at least 1 nondisplaced rib fracture

## 2023-07-23 NOTE — Assessment & Plan Note (Signed)
Patient had a mechanical fall today as LOC denies striking her head.

## 2023-07-23 NOTE — Patient Instructions (Addendum)
Nice to see you today I have sent in pain medication. Use caution as it can make you sleepy I will be in touch with the xrays once I have the reports.  Follow up with Ann Cordova if no improvement Take the rest of the week off of work

## 2023-07-24 ENCOUNTER — Telehealth: Payer: Self-pay | Admitting: Primary Care

## 2023-07-24 DIAGNOSIS — R0789 Other chest pain: Secondary | ICD-10-CM

## 2023-07-24 DIAGNOSIS — W19XXXA Unspecified fall, initial encounter: Secondary | ICD-10-CM

## 2023-07-24 DIAGNOSIS — M25511 Pain in right shoulder: Secondary | ICD-10-CM

## 2023-07-24 NOTE — Telephone Encounter (Signed)
Patient asked if medication HYDROcodone-acetaminophen (NORCO) 5-325 MG tablet  could be sent to [facility] in [place], [place]? States the pharmacy it was originally sent to does not have it and it is backordered.

## 2023-07-24 NOTE — Telephone Encounter (Signed)
Pt called back to get a update on rx? Call back # [phone]

## 2023-07-24 NOTE — Telephone Encounter (Signed)
Called patient to inform her that Susy Frizzle is out for the day and that I will forward her pharmacy request to him. [facility] [address] [facility]. Pt wants prescription sent to [facility] pharmacy.   Melina Copa, CMA

## 2023-07-25 MED ORDER — HYDROCODONE-ACETAMINOPHEN 5-325 MG PO TABS
1.0000 | ORAL_TABLET | Freq: Three times a day (TID) | ORAL | 0 refills | Status: AC | PRN
Start: 2023-07-25 — End: 2023-07-30

## 2023-07-25 NOTE — Addendum Note (Signed)
Addended by: Eden Emms on: 07/25/2023 10:48 AM   Modules accepted: Orders

## 2023-07-25 NOTE — Telephone Encounter (Signed)
Called [facility] and cancelled original rx as requested

## 2023-07-25 NOTE — Telephone Encounter (Signed)
Can we call the pharmacy that I sent it to orginally and cancel the script, I sent to the requested pharmacy

## 2023-08-01 DIAGNOSIS — S42021A Displaced fracture of shaft of right clavicle, initial encounter for closed fracture: Secondary | ICD-10-CM | POA: Diagnosis not present

## 2023-09-11 DIAGNOSIS — S42021A Displaced fracture of shaft of right clavicle, initial encounter for closed fracture: Secondary | ICD-10-CM | POA: Diagnosis not present

## 2023-09-14 ENCOUNTER — Encounter: Payer: Self-pay | Admitting: Primary Care

## 2023-10-23 DIAGNOSIS — S42021A Displaced fracture of shaft of right clavicle, initial encounter for closed fracture: Secondary | ICD-10-CM | POA: Diagnosis not present

## 2024-02-13 ENCOUNTER — Ambulatory Visit (INDEPENDENT_AMBULATORY_CARE_PROVIDER_SITE_OTHER): Payer: Medicare Other

## 2024-02-13 VITALS — Ht 65.0 in | Wt 111.0 lb

## 2024-02-13 DIAGNOSIS — Z0000 Encounter for general adult medical examination without abnormal findings: Secondary | ICD-10-CM | POA: Diagnosis not present

## 2024-02-13 NOTE — Patient Instructions (Signed)
 Ms. Peregrina , Thank you for taking time to come for your Medicare Wellness Visit. I appreciate your ongoing commitment to your health goals. Please review the following plan we discussed and let me know if I can assist you in the future.   Referrals/Orders/Follow-Ups/Clinician Recommendations: none  This is a list of the screening recommended for you and due dates:  Health Maintenance  Topic Date Due   Mammogram  02/05/2020   Flu Shot  07/26/2023   COVID-19 Vaccine (1 - 2024-25 season) Never done   Medicare Annual Wellness Visit  02/12/2025   Colon Cancer Screening  03/21/2025   Pneumonia Vaccine  Completed   DEXA scan (bone density measurement)  Completed   Hepatitis C Screening  Completed   HPV Vaccine  Aged Out   DTaP/Tdap/Td vaccine  Discontinued   Zoster (Shingles) Vaccine  Discontinued    Advanced directives: (Declined) Advance directive discussed with you today. Even though you declined this today, please call our office should you change your mind, and we can give you the proper paperwork for you to fill out.  Next Medicare Annual Wellness Visit scheduled for next year: Yes 02/13/25 @ 8:50am televisit

## 2024-02-13 NOTE — Progress Notes (Signed)
 Subjective:   Ann Cordova is a 73 y.o. female who presents for Medicare Annual (Subsequent) preventive examination.  Visit Complete: Virtual I connected with  Ann Cordova on 02/13/24 by a audio enabled telemedicine application and verified that I am speaking with the correct person using two identifiers.  Patient Location: Home  Provider Location: Home Office  I discussed the limitations of evaluation and management by telemedicine. The patient expressed understanding and agreed to proceed.  Vital Signs: Because this visit was a virtual/telehealth visit, some criteria may be missing or patient reported. Any vitals not documented were not able to be obtained and vitals that have been documented are patient reported.  Patient Medicare AWV questionnaire was completed by the patient on (not done); I have confirmed that all information answered by patient is correct and no changes since this date.  Cardiac Risk Factors include: advanced age (>43men, >69 women);dyslipidemia;sedentary lifestyle     Objective:    Today's Vitals   02/13/24 0816  Weight: 111 lb (50.3 kg)  Height: 5\' 5"  (1.651 m)  PainSc: 5    Body mass index is 18.47 kg/m.     02/13/2024    8:33 AM 02/08/2023    8:45 AM 06/28/2022    1:19 PM 04/10/2022   11:47 PM 04/10/2022   12:48 PM 03/21/2022   10:14 AM 02/07/2022    9:00 AM  Advanced Directives  Does Patient Have a Medical Advance Directive? No No No No No No No  Would patient like information on creating a medical advance directive?  No - Patient declined  No - Patient declined No - Patient declined No - Patient declined Yes (MAU/Ambulatory/Procedural Areas - Information given)    Current Medications (verified) Outpatient Encounter Medications as of 02/13/2024  Medication Sig   alendronate (FOSAMAX) 70 MG tablet TAKE 1 TABLET BY MOUTH WEEKLY ON AN EMPTY STOMACH WITH WATER ONLY. NO FOOD FOR 30 MINUTES AND DO NOT LAY DOWN FOR ONE HOURS AFTER   aspirin EC 81  MG tablet Take 81 mg by mouth daily. Swallow whole.   FLUoxetine (PROZAC) 40 MG capsule TAKE 1 CAPSULE (40 MG TOTAL) BY MOUTH DAILY. FOR DEPRESSION   predniSONE (DELTASONE) 20 MG tablet Take two tablets my mouth once daily for four days, then one tablet once daily for four days. (Patient not taking: Reported on 02/13/2024)   tiZANidine (ZANAFLEX) 2 MG tablet Take 1 tablet (2 mg total) by mouth every 8 (eight) hours as needed for up to 15 doses for muscle spasms. (Patient not taking: Reported on 02/13/2024)   No facility-administered encounter medications on file as of 02/13/2024.    Allergies (verified) Patient has no known allergies.   History: Past Medical History:  Diagnosis Date   Acute appendicitis with localized peritonitis 04/10/2022   Past Surgical History:  Procedure Laterality Date   COLONOSCOPY WITH PROPOFOL N/A 03/21/2022   Procedure: COLONOSCOPY WITH PROPOFOL;  Surgeon: Wyline Mood, MD;  Location: [facility] ENDOSCOPY;  Service: Gastroenterology;  Laterality: N/A;   XI ROBOTIC LAPAROSCOPIC ASSISTED APPENDECTOMY N/A 04/10/2022   Procedure: XI ROBOTIC LAPAROSCOPIC ASSISTED APPENDECTOMY;  Surgeon: Carolan Shiver, MD;  Location: [facility] ORS;  Service: General;  Laterality: N/A;   No family history on file. Social History   Socioeconomic History   Marital status: Widowed    Spouse name: Not on file   Number of children: Not on file   Years of education: Not on file   Highest education level: Not on file  Occupational  History   Not on file  Tobacco Use   Smoking status: Former   Smokeless tobacco: Never  Vaping Use   Vaping status: Never Used  Substance and Sexual Activity   Alcohol use: No   Drug use: Not Currently   Sexual activity: Not on file  Other Topics Concern   Not on file  Social History Narrative   Married.   2 children, 6 grand children.   Retired. Worked in Engineering geologist.   Takes Cordova of her mother and husband.    Social Drivers of Research scientist (physical sciences) Strain: Low Risk  (02/13/2024)   Overall Financial Resource Strain (CARDIA)    Difficulty of Paying Living Expenses: Not hard at all  Food Insecurity: No Food Insecurity (02/13/2024)   Hunger Vital Sign    Worried About Running Out of Food in the Last Year: Never true    Ran Out of Food in the Last Year: Never true  Transportation Needs: No Transportation Needs (02/13/2024)   PRAPARE - Administrator, Civil Service (Medical): No    Lack of Transportation (Non-Medical): No  Physical Activity: Inactive (02/13/2024)   Exercise Vital Sign    Days of Exercise per Week: 0 days    Minutes of Exercise per Session: 0 min  Stress: No Stress Concern Present (02/13/2024)   [employer] of Occupational Health - Occupational Stress Questionnaire    Feeling of Stress : Not at all  Social Connections: Moderately Integrated (02/13/2024)   Social Connection and Isolation Panel [NHANES]    Frequency of Communication with Friends and Family: More than three times a week    Frequency of Social Gatherings with Friends and Family: More than three times a week    Attends Religious Services: More than 4 times per year    Active Member of [employer] or Organizations: Yes    Attends Banker Meetings: More than 4 times per year    Marital Status: Widowed    Tobacco Counseling Counseling given: Not Answered   Clinical Intake:  Pre-visit preparation completed: Yes  Pain : 0-10 Pain Score: 5  Pain Type: Chronic pain Pain Location: Foot (both feet) Pain Descriptors / Indicators: Aching, Burning, Throbbing Pain Onset: More than a month ago Pain Frequency: Constant     BMI - recorded: 18.47 Nutritional Status: BMI <19  Underweight Nutritional Risks: None Diabetes: No  How often do you need to have someone help you when you read instructions, pamphlets, or other written materials from your doctor or pharmacy?: 1 - Never  Interpreter Needed?: No  Comments: lives  alone Information entered by :: Ann Dearmas,LPN   Activities of Daily Living    02/13/2024    8:33 AM  In your present state of health, do you have any difficulty performing the following activities:  Hearing? 0  Vision? 0  Difficulty concentrating or making decisions? 0  Walking or climbing stairs? 1  Dressing or bathing? 0  Doing errands, shopping? 0  Preparing Food and eating ? N  Using the Toilet? N  In the past six months, have you accidently leaked urine? Y  Do you have problems with loss of bowel control? N  Managing your Medications? N  Managing your Finances? N  Housekeeping or managing your Housekeeping? N    Patient Cordova Team: Doreene Nest, NP as PCP - General (Internal Medicine)  Indicate any recent Medical Services you may have received from other than [facility] providers in the past year (  date may be approximate).     Assessment:   This is a routine wellness examination for Ann Cordova.  Hearing/Vision screen Hearing Screening - Comments:: Pt says her hearing is alright;little loss Vision Screening - Comments:: Pt says her vision is good with glasses Yearly exam with [facility] [place]   Goals Addressed             This Visit's Progress    Patient Stated       02/13/2024,  I will continue to keep all scheduled appointments with my PCP.     Patient Stated   On track    02/13/2024, I will maintain and continue medications as prescribed.      Patient Stated       02/13/24-Would like to drink more water and move more (walking)       Depression Screen    02/13/2024    8:28 AM 07/23/2023    2:43 PM 03/20/2023   11:14 AM 02/08/2023    8:35 AM 07/07/2022    3:23 PM 02/21/2022    2:06 PM 02/07/2022    9:04 AM  PHQ 2/9 Scores  PHQ - 2 Score 1 1 0 0 0 1 0  PHQ- 9 Score  7 2  0 7     Fall Risk    02/13/2024    8:22 AM 07/23/2023    2:44 PM 03/20/2023   11:14 AM 02/08/2023    8:46 AM 02/07/2022    9:01 AM  Fall Risk   Falls in the past year? 1 1 1 1 1    Comment     tripped  Number falls in past yr: 0 1 0 0 0  Injury with Fall? 1 1 1 1  0  Comment broke collar bone and ribs last year   Siatic nerve pain   Risk for fall due to : Impaired balance/gait;History of fall(s) History of fall(s);Other (Comment) History of fall(s) Other (Comment) No Fall Risks  Risk for fall due to: Comment    Tripped over shoe   Follow up Education provided;Falls prevention discussed Falls evaluation completed Falls evaluation completed Falls evaluation completed;Falls prevention discussed;Education provided Falls prevention discussed    MEDICARE RISK AT HOME: Medicare Risk at Home Any stairs in or around the home?: No (has ramp for outside) If so, are there any without handrails?: No Home free of loose throw rugs in walkways, pet beds, electrical cords, etc?: Yes Adequate lighting in your home to reduce risk of falls?: Yes Life alert?: No Use of a cane, walker or w/c?: No Grab bars in the bathroom?: Yes Shower chair or bench in shower?: Yes Elevated toilet seat or a handicapped toilet?: Yes  TIMED UP AND GO:  Was the test performed?  No    Cognitive Function:    02/04/2021    8:22 AM 12/02/2018    1:00 PM  MMSE - Mini Mental State Exam  Orientation to time 5 5  Orientation to Place 5 5  Registration 3 3  Attention/ Calculation 5 0  Recall 3 3  Language- name 2 objects  0  Language- repeat 1 1  Language- follow 3 step command  3  Language- read & follow direction  0  Write a sentence  0  Copy design  0  Total score  20        02/13/2024    8:35 AM 02/08/2023    9:01 AM  6CIT Screen  What Year? 0 points 0 points  What  month? 0 points 0 points  What time? 0 points 0 points  Count back from 20 0 points 0 points  Months in reverse 0 points 0 points  Repeat phrase 0 points 0 points  Total Score 0 points 0 points    Immunizations Immunization History  Administered Date(s) Administered   Influenza,inj,Quad PF,6+ Mos 10/10/2017, 12/02/2018    Pneumococcal Conjugate-13 10/10/2017   Pneumococcal Polysaccharide-23 12/02/2018    TDAP status: Up to date  Flu Vaccine status: Declined, Education has been provided regarding the importance of this vaccine but patient still declined. Advised may receive this vaccine at local pharmacy or Health Dept. Aware to provide a copy of the vaccination record if obtained from local pharmacy or Health Dept. Verbalized acceptance and understanding.  Pneumococcal vaccine status: Up to date  Covid-19 vaccine status: Declined, Education has been provided regarding the importance of this vaccine but patient still declined. Advised may receive this vaccine at local pharmacy or Health Dept.or vaccine clinic. Aware to provide a copy of the vaccination record if obtained from local pharmacy or Health Dept. Verbalized acceptance and understanding.  Qualifies for Shingles Vaccine? Yes   Zostavax completed No   Shingrix Completed?: No.    Education has been provided regarding the importance of this vaccine. Patient has been advised to call insurance company to determine out of pocket expense if they have not yet received this vaccine. Advised may also receive vaccine at local pharmacy or Health Dept. Verbalized acceptance and understanding.  Screening Tests Health Maintenance  Topic Date Due   MAMMOGRAM  02/05/2020   INFLUENZA VACCINE  07/26/2023   COVID-19 Vaccine (1 - 2024-25 season) Never done   Medicare Annual Wellness (AWV)  02/12/2025   Colonoscopy  03/21/2025   Pneumonia Vaccine 37+ Years old  Completed   DEXA SCAN  Completed   Hepatitis C Screening  Completed   HPV VACCINES  Aged Out   DTaP/Tdap/Td  Discontinued   Zoster Vaccines- Shingrix  Discontinued    Health Maintenance  Health Maintenance Due  Topic Date Due   MAMMOGRAM  02/05/2020   INFLUENZA VACCINE  07/26/2023   COVID-19 Vaccine (1 - 2024-25 season) Never done    Colorectal cancer screening: Type of screening: Colonoscopy.  Completed 03/21/2022. Repeat every 3 years  Mammogram status: Completed 02/04/2019. Repeat every year ordered already  Bone Density status: Completed 02/04/2019. Results reflect: Bone density results: OSTEOPOROSIS. Repeat every 3 years. ordered already  Lung Cancer Screening: (Low Dose CT Chest recommended if Age 62-80 years, 20 pack-year currently smoking OR have quit w/in 15years.) does not qualify.   Lung Cancer Screening Referral: no  Additional Screening:  Hepatitis C Screening: does not qualify; Completed 12/02/2018  Vision Screening: Recommended annual ophthalmology exams for early detection of glaucoma and other disorders of the eye. Is the patient up to date with their annual eye exam?  Yes  Who is the provider or what is the name of the office in which the patient attends annual eye exams? [facility] in [place] If pt is not established with a provider, would they like to be referred to a provider to establish Cordova? Yes .   Dental Screening: Recommended annual dental exams for proper oral hygiene  Diabetic Foot Exam: n/a  Community Resource Referral / Chronic Cordova Management: CRR required this visit?  No   CCM required this visit?  No    Plan:     I have personally reviewed and noted the following in the patient's chart:  Medical and social history Use of alcohol, tobacco or illicit drugs  Current medications and supplements including opioid prescriptions. Patient is not currently taking opioid prescriptions. Functional ability and status Nutritional status Physical activity Advanced directives List of other physicians Hospitalizations, surgeries, and ER visits in previous 12 months Vitals Screenings to include cognitive, depression, and falls Referrals and appointments  In addition, I have reviewed and discussed with patient certain preventive protocols, quality metrics, and best practice recommendations. A written personalized Cordova plan for preventive services  as well as general preventive health recommendations were provided to patient.    Sue Lush, LPN   [id number]/82/9562   After Visit Summary: (MyChart) Due to this being a telephonic visit, the after visit summary with patients personalized plan was offered to patient via MyChart   Nurse Notes: Pt says she is concerned about the pain, throbbing and numbness in both her feet which has caused her to fall several times. Pt relays she does not exercise anymore due to this. Pt indicates she will call and make appt for MMG and Bone Density (orders already in). Pt has no other concerns or questions at this time.  Vaccinations: declines: Influenza vaccine: recommend every Fall Shingles vaccine: recommend Shingrix which is 2 doses 2-6 months apart and over 90% effective     Covid-19: recommend 2 doses one month apart with a booster 6 months later

## 2024-03-06 ENCOUNTER — Telehealth: Payer: Self-pay | Admitting: Primary Care

## 2024-03-06 DIAGNOSIS — Z1231 Encounter for screening mammogram for malignant neoplasm of breast: Secondary | ICD-10-CM

## 2024-03-06 DIAGNOSIS — Z13820 Encounter for screening for osteoporosis: Secondary | ICD-10-CM

## 2024-03-06 NOTE — Telephone Encounter (Signed)
 Patient called back in, she stated her orders for mammo and bone density were going to expire before [facility] first available appt. She asked that we reorder those. Mammo and Dexa reordered. Patient scheduled for CPE this month.

## 2024-03-06 NOTE — Telephone Encounter (Signed)
 Copied from CRM [id number]. Topic: Clinical - Request for Lab/Test Order >> Mar 06, 2024 11:53 AM Denese Killings wrote: Reason for CRM: Patient is wanting to schedule a mammogram and bone density test.

## 2024-03-06 NOTE — Addendum Note (Signed)
 Addended by: Lonia Blood on: 03/06/2024 01:22 PM   Modules accepted: Orders

## 2024-03-06 NOTE — Telephone Encounter (Signed)
 Unable to reach patient. Left voicemail to return call to our office.

## 2024-03-18 ENCOUNTER — Other Ambulatory Visit: Payer: Self-pay | Admitting: Primary Care

## 2024-03-18 DIAGNOSIS — F331 Major depressive disorder, recurrent, moderate: Secondary | ICD-10-CM

## 2024-03-21 ENCOUNTER — Ambulatory Visit: Payer: Medicare Other | Admitting: Primary Care

## 2024-03-21 ENCOUNTER — Other Ambulatory Visit: Payer: Self-pay | Admitting: Primary Care

## 2024-03-21 ENCOUNTER — Encounter: Payer: Self-pay | Admitting: Primary Care

## 2024-03-21 VITALS — BP 118/64 | HR 82 | Temp 97.2°F | Ht 65.0 in | Wt 99.0 lb

## 2024-03-21 DIAGNOSIS — Z0001 Encounter for general adult medical examination with abnormal findings: Secondary | ICD-10-CM

## 2024-03-21 DIAGNOSIS — E785 Hyperlipidemia, unspecified: Secondary | ICD-10-CM | POA: Diagnosis not present

## 2024-03-21 DIAGNOSIS — R202 Paresthesia of skin: Secondary | ICD-10-CM

## 2024-03-21 DIAGNOSIS — F331 Major depressive disorder, recurrent, moderate: Secondary | ICD-10-CM

## 2024-03-21 DIAGNOSIS — Z0000 Encounter for general adult medical examination without abnormal findings: Secondary | ICD-10-CM | POA: Diagnosis not present

## 2024-03-21 DIAGNOSIS — R739 Hyperglycemia, unspecified: Secondary | ICD-10-CM

## 2024-03-21 DIAGNOSIS — M810 Age-related osteoporosis without current pathological fracture: Secondary | ICD-10-CM

## 2024-03-21 DIAGNOSIS — R1900 Intra-abdominal and pelvic swelling, mass and lump, unspecified site: Secondary | ICD-10-CM

## 2024-03-21 LAB — CBC
HCT: 38.7 % (ref 36.0–46.0)
Hemoglobin: 12.9 g/dL (ref 12.0–15.0)
MCHC: 33.4 g/dL (ref 30.0–36.0)
MCV: 88.8 fl (ref 78.0–100.0)
Platelets: 352 10*3/uL (ref 150.0–400.0)
RBC: 4.36 Mil/uL (ref 3.87–5.11)
RDW: 14.5 % (ref 11.5–15.5)
WBC: 8.3 10*3/uL (ref 4.0–10.5)

## 2024-03-21 LAB — COMPREHENSIVE METABOLIC PANEL WITH GFR
ALT: 13 U/L (ref 0–35)
AST: 19 U/L (ref 0–37)
Albumin: 4.4 g/dL (ref 3.5–5.2)
Alkaline Phosphatase: 57 U/L (ref 39–117)
BUN: 16 mg/dL (ref 6–23)
CO2: 30 meq/L (ref 19–32)
Calcium: 9.5 mg/dL (ref 8.4–10.5)
Chloride: 102 meq/L (ref 96–112)
Creatinine, Ser: 0.5 mg/dL (ref 0.40–1.20)
GFR: 93.94 mL/min (ref >60.00)
Glucose, Bld: 116 mg/dL — ABNORMAL HIGH (ref 70–99)
Potassium: 4.2 meq/L (ref 3.5–5.1)
Sodium: 140 meq/L (ref 135–145)
Total Bilirubin: 0.5 mg/dL (ref 0.2–1.2)
Total Protein: 6.5 g/dL (ref 6.0–8.3)

## 2024-03-21 LAB — LIPID PANEL
Cholesterol: 226 mg/dL — ABNORMAL HIGH (ref 0–200)
HDL: 71 mg/dL (ref >39.00)
LDL Cholesterol: 131 mg/dL — ABNORMAL HIGH (ref 0–99)
NonHDL: 154.54
Total CHOL/HDL Ratio: 3
Triglycerides: 116 mg/dL (ref 0.0–149.0)
VLDL: 23.2 mg/dL (ref 0.0–40.0)

## 2024-03-21 NOTE — Progress Notes (Signed)
 Subjective:    Patient ID: Ann Cordova, female    DOB: Oct 05, 1951, 73 y.o.   MRN: [id number]  HPI  Ann Cordova is a very pleasant 73 y.o. female who presents today for complete physical and follow up of chronic conditions.  She continues to experience chronic bilateral foot pain. She describes her pain as tingling and burning. Her symptoms are constant, worse when resting after being on her feet for prolonged periods of time. Sometimes she cannot feel her feet because they are so numb. Evaluated by podiatry in April 2024, was told that she has arthritis in her feet and to wear custom orthotics. She had her custom orthotics made, does not wear them daily. She's applied Voltaren Gel with improvement.   Immunizations:  -Shingles: Never completed Shingrix series -Pneumonia: Completed Prevnar 13 in 2018, Pneumovax 23 in 2019  Diet: Fair diet.  Exercise: No regular exercise.  Eye exam: Completes annually  Dental exam: Completes once annually    Mammogram: Completed in 2020, scheduled April 2025 Bone Density Scan: Completed in 2020, scheduled April 2025  Colonoscopy: Completed in 2023, due 2026  BP Readings from Last 3 Encounters:  03/21/24 118/64  07/23/23 130/82  03/20/23 134/76   Wt Readings from Last 3 Encounters:  03/21/24 99 lb (44.9 kg)  02/13/24 111 lb (50.3 kg)  03/20/23 111 lb (50.3 kg)       Review of Systems  Constitutional:  Negative for unexpected weight change.  HENT:  Negative for rhinorrhea.   Respiratory:  Negative for cough and shortness of breath.   Cardiovascular:  Negative for chest pain.  Gastrointestinal:  Negative for constipation and diarrhea.  Genitourinary:  Negative for difficulty urinating.  Musculoskeletal:  Positive for arthralgias.  Skin:  Negative for rash.  Allergic/Immunologic: Negative for environmental allergies.  Neurological:  Negative for dizziness and headaches.  Psychiatric/Behavioral:  The patient is not nervous/anxious.           Past Medical History:  Diagnosis Date   Acute appendicitis with localized peritonitis 04/10/2022   Acute pain of right shoulder 07/23/2023   Closed displaced fracture of right clavicle 07/23/2023   Piriformis syndrome, left 07/07/2022    Social History   Socioeconomic History   Marital status: Widowed    Spouse name: Not on file   Number of children: Not on file   Years of education: Not on file   Highest education level: Not on file  Occupational History   Not on file  Tobacco Use   Smoking status: Former   Smokeless tobacco: Never  Vaping Use   Vaping status: Never Used  Substance and Sexual Activity   Alcohol use: No   Drug use: Not Currently   Sexual activity: Not on file  Other Topics Concern   Not on file  Social History Narrative   Married.   2 children, 6 grand children.   Retired. Worked in Engineering geologist.   Takes care of her mother and husband.    Social Drivers of Corporate investment banker Strain: Low Risk  (02/13/2024)   Overall Financial Resource Strain (CARDIA)    Difficulty of Paying Living Expenses: Not hard at all  Food Insecurity: No Food Insecurity (02/13/2024)   Hunger Vital Sign    Worried About Running Out of Food in the Last Year: Never true    Ran Out of Food in the Last Year: Never true  Transportation Needs: No Transportation Needs (02/13/2024)   PRAPARE - Transportation  Lack of Transportation (Medical): No    Lack of Transportation (Non-Medical): No  Physical Activity: Inactive (02/13/2024)   Exercise Vital Sign    Days of Exercise per Week: 0 days    Minutes of Exercise per Session: 0 min  Stress: No Stress Concern Present (02/13/2024)   [employer] of Occupational Health - Occupational Stress Questionnaire    Feeling of Stress : Not at all  Social Connections: Moderately Integrated (02/13/2024)   Social Connection and Isolation Panel [NHANES]    Frequency of Communication with Friends and Family: More than three times a  week    Frequency of Social Gatherings with Friends and Family: More than three times a week    Attends Religious Services: More than 4 times per year    Active Member of [employer] or Organizations: Yes    Attends Banker Meetings: More than 4 times per year    Marital Status: Widowed  Intimate Partner Violence: Not At Risk (02/13/2024)   Humiliation, Afraid, Rape, and Kick questionnaire    Fear of Current or Ex-Partner: No    Emotionally Abused: No    Physically Abused: No    Sexually Abused: No    Past Surgical History:  Procedure Laterality Date   COLONOSCOPY WITH PROPOFOL N/A 03/21/2022   Procedure: COLONOSCOPY WITH PROPOFOL;  Surgeon: Wyline Mood, MD;  Location: [facility] ENDOSCOPY;  Service: Gastroenterology;  Laterality: N/A;   XI ROBOTIC LAPAROSCOPIC ASSISTED APPENDECTOMY N/A 04/10/2022   Procedure: XI ROBOTIC LAPAROSCOPIC ASSISTED APPENDECTOMY;  Surgeon: Carolan Shiver, MD;  Location: [facility] ORS;  Service: General;  Laterality: N/A;    History reviewed. No pertinent family history.  No Known Allergies  Current Outpatient Medications on File Prior to Visit  Medication Sig Dispense Refill   aspirin EC 81 MG tablet Take 81 mg by mouth daily. Swallow whole.     FLUoxetine (PROZAC) 40 MG capsule TAKE 1 CAPSULE (40 MG TOTAL) BY MOUTH DAILY. FOR DEPRESSION 90 capsule 0   alendronate (FOSAMAX) 70 MG tablet TAKE 1 TABLET BY MOUTH WEEKLY ON AN EMPTY STOMACH WITH WATER ONLY. NO FOOD FOR 30 MINUTES AND DO NOT LAY DOWN FOR ONE HOURS AFTER (Patient not taking: Reported on 03/21/2024) 4 tablet 0   No current facility-administered medications on file prior to visit.    BP 118/64   Pulse 82   Temp (!) 97.2 F (36.2 C) (Temporal)   Ht 5\' 5"  (1.651 m)   Wt 99 lb (44.9 kg)   SpO2 99%   BMI 16.47 kg/m  Objective:   Physical Exam HENT:     Right Ear: Tympanic membrane and ear canal normal.     Left Ear: Tympanic membrane and ear canal normal.  Eyes:     Pupils: Pupils are  equal, round, and reactive to light.  Cardiovascular:     Rate and Rhythm: Normal rate and regular rhythm.  Pulmonary:     Effort: Pulmonary effort is normal.     Breath sounds: Normal breath sounds.  Abdominal:     General: Bowel sounds are normal.     Palpations: Abdomen is soft.     Tenderness: There is no abdominal tenderness.     Comments: Soft, non tender mass to left upper pelvis including mons pubis.  Musculoskeletal:        General: Normal range of motion.     Cervical back: Neck supple.  Skin:    General: Skin is warm and dry.  Neurological:     Mental  Status: She is alert and oriented to person, place, and time.     Cranial Nerves: No cranial nerve deficit.     Deep Tendon Reflexes:     Reflex Scores:      Patellar reflexes are 2+ on the right side and 2+ on the left side. Psychiatric:        Mood and Affect: Mood normal.           Assessment & Plan:  Encounter for annual general medical examination with abnormal findings in adult Assessment & Plan: Discussed Shingrix vaccines.  Declines influenza vaccine Mammogram and bone density scan scheduled. Colonoscopy UTD, due 2026  Discussed the importance of a healthy diet and regular exercise in order for weight loss, and to reduce the risk of further co-morbidity.  Exam stable. Labs pending.  Follow up in 1 year for repeat physical.    Osteoporosis, unspecified osteoporosis type, unspecified pathological fracture presence Assessment & Plan: Repeat bone density scan pending. Has not had Fosamax since 2023. Consider resuming.  Await results.    Hyperlipidemia, unspecified hyperlipidemia type Assessment & Plan: Repeat lipid panel pending.   Orders: -     Lipid panel -     CBC -     Comprehensive metabolic panel with GFR  Moderate episode of recurrent major depressive disorder (HCC) Assessment & Plan: Controlled.  We discussed a dose reduction to 20 mg, she declines.  Continue fluoxetine 40 mg  daily.  Continue to monitor.    Pelvic mass Assessment & Plan: Chronic for at least 1 year, patient did not mention during HPI, this was found on exam.  Differentials include hernia versus other pelvic mass.  In the setting of unexplained weight loss, Stat CT pelvis ordered and pending. Await results.  Orders: -     CT PELVIS LIMITED WO CONTRAST; Future  Paresthesias Assessment & Plan: Continued.  Reviewed podiatry notes from April 2024. Recommended she wear orthotics daily.  Offered treatment such as gabapentin for which she kindly declines. Continue Voltaren gel as needed.  She will update.         Doreene Nest, NP

## 2024-03-21 NOTE — Assessment & Plan Note (Signed)
 Discussed Shingrix vaccines.  Declines influenza vaccine Mammogram and bone density scan scheduled. Colonoscopy UTD, due 2026  Discussed the importance of a healthy diet and regular exercise in order for weight loss, and to reduce the risk of further co-morbidity.  Exam stable. Labs pending.  Follow up in 1 year for repeat physical.

## 2024-03-21 NOTE — Assessment & Plan Note (Signed)
 Controlled.  We discussed a dose reduction to 20 mg, she declines.  Continue fluoxetine 40 mg daily.  Continue to monitor.

## 2024-03-21 NOTE — Patient Instructions (Signed)
 Stop by the lab prior to leaving today. I will notify you of your results once received.   You will receive a phone call today regarding the CT scan.  Complete your bone density mammogram.  It was a pleasure to see you today!

## 2024-03-21 NOTE — Assessment & Plan Note (Signed)
 Repeat bone density scan pending. Has not had Fosamax since 2023. Consider resuming.  Await results.

## 2024-03-21 NOTE — Assessment & Plan Note (Addendum)
 Chronic for at least 1 year, patient did not mention during HPI, this was found on exam.  Differentials include hernia versus other pelvic mass.  In the setting of unexplained weight loss, Stat CT pelvis ordered and pending. Await results.

## 2024-03-21 NOTE — Assessment & Plan Note (Signed)
 Continued.  Reviewed podiatry notes from April 2024. Recommended she wear orthotics daily.  Offered treatment such as gabapentin for which she kindly declines. Continue Voltaren gel as needed.  She will update.

## 2024-03-21 NOTE — Assessment & Plan Note (Signed)
 Repeat lipid panel pending.

## 2024-03-24 ENCOUNTER — Ambulatory Visit (INDEPENDENT_AMBULATORY_CARE_PROVIDER_SITE_OTHER)

## 2024-03-24 ENCOUNTER — Other Ambulatory Visit: Payer: Self-pay | Admitting: Primary Care

## 2024-03-24 ENCOUNTER — Ambulatory Visit
Admission: RE | Admit: 2024-03-24 | Discharge: 2024-03-24 | Disposition: A | Discharge status: Home / Self Care | Source: Ambulatory Visit | Attending: Primary Care | Admitting: Primary Care

## 2024-03-24 DIAGNOSIS — R739 Hyperglycemia, unspecified: Secondary | ICD-10-CM | POA: Diagnosis not present

## 2024-03-24 DIAGNOSIS — E785 Hyperlipidemia, unspecified: Secondary | ICD-10-CM

## 2024-03-24 DIAGNOSIS — M810 Age-related osteoporosis without current pathological fracture: Secondary | ICD-10-CM

## 2024-03-24 DIAGNOSIS — R1900 Intra-abdominal and pelvic swelling, mass and lump, unspecified site: Secondary | ICD-10-CM

## 2024-03-24 DIAGNOSIS — K4090 Unilateral inguinal hernia, without obstruction or gangrene, not specified as recurrent: Secondary | ICD-10-CM | POA: Diagnosis not present

## 2024-03-24 DIAGNOSIS — K5730 Diverticulosis of large intestine without perforation or abscess without bleeding: Secondary | ICD-10-CM | POA: Diagnosis not present

## 2024-03-24 DIAGNOSIS — Z0001 Encounter for general adult medical examination with abnormal findings: Secondary | ICD-10-CM

## 2024-03-24 DIAGNOSIS — R202 Paresthesia of skin: Secondary | ICD-10-CM

## 2024-03-24 DIAGNOSIS — F331 Major depressive disorder, recurrent, moderate: Secondary | ICD-10-CM

## 2024-03-24 LAB — HEMOGLOBIN A1C: Hgb A1c MFr Bld: 5.5 % (ref 4.6–6.5)

## 2024-03-24 MED ORDER — ATORVASTATIN CALCIUM 10 MG PO TABS
10.0000 mg | ORAL_TABLET | Freq: Every day | ORAL | 3 refills | Status: AC
Start: 2024-03-24 — End: ?

## 2024-04-09 ENCOUNTER — Ambulatory Visit
Admission: RE | Admit: 2024-04-09 | Discharge: 2024-04-09 | Disposition: A | Discharge status: Home / Self Care | Source: Ambulatory Visit | Attending: Primary Care | Admitting: Primary Care

## 2024-04-09 DIAGNOSIS — Z780 Asymptomatic menopausal state: Secondary | ICD-10-CM | POA: Insufficient documentation

## 2024-04-09 DIAGNOSIS — M810 Age-related osteoporosis without current pathological fracture: Secondary | ICD-10-CM | POA: Diagnosis not present

## 2024-04-09 DIAGNOSIS — Z1231 Encounter for screening mammogram for malignant neoplasm of breast: Secondary | ICD-10-CM | POA: Diagnosis not present

## 2024-04-09 DIAGNOSIS — Z13820 Encounter for screening for osteoporosis: Secondary | ICD-10-CM | POA: Diagnosis not present

## 2024-04-16 ENCOUNTER — Other Ambulatory Visit: Payer: Self-pay | Admitting: Primary Care

## 2024-04-16 DIAGNOSIS — M810 Age-related osteoporosis without current pathological fracture: Secondary | ICD-10-CM

## 2024-04-16 MED ORDER — ALENDRONATE SODIUM 70 MG PO TABS: ORAL_TABLET | ORAL | 3 refills | Status: AC

## 2024-05-27 ENCOUNTER — Other Ambulatory Visit (INDEPENDENT_AMBULATORY_CARE_PROVIDER_SITE_OTHER)

## 2024-05-27 ENCOUNTER — Ambulatory Visit: Payer: Self-pay | Admitting: Primary Care

## 2024-05-27 DIAGNOSIS — E785 Hyperlipidemia, unspecified: Secondary | ICD-10-CM | POA: Diagnosis not present

## 2024-05-27 LAB — HEPATIC FUNCTION PANEL
ALT: 16 U/L (ref 0–35)
AST: 24 U/L (ref 0–37)
Albumin: 4.3 g/dL (ref 3.5–5.2)
Alkaline Phosphatase: 55 U/L (ref 39–117)
Bilirubin, Direct: 0.1 mg/dL (ref 0.0–0.3)
Total Bilirubin: 0.6 mg/dL (ref 0.2–1.2)
Total Protein: 6.4 g/dL (ref 6.0–8.3)

## 2024-05-27 LAB — LIPID PANEL
Cholesterol: 170 mg/dL (ref 0–200)
HDL: 65.9 mg/dL (ref >39.00)
LDL Cholesterol: 82 mg/dL (ref 0–99)
NonHDL: 104.31
Total CHOL/HDL Ratio: 3
Triglycerides: 113 mg/dL (ref 0.0–149.0)
VLDL: 22.6 mg/dL (ref 0.0–40.0)

## 2024-06-02 NOTE — Telephone Encounter (Signed)
 Copied from CRM [id number]. Topic: Clinical - Lab/Test Results >> Jun 02, 2024 12:40 PM Shereese L wrote: Reason for CRM: patient calling in requesting to be contacted in reference to her results

## 2024-06-18 ENCOUNTER — Other Ambulatory Visit: Payer: Self-pay | Admitting: Primary Care

## 2024-06-18 DIAGNOSIS — F331 Major depressive disorder, recurrent, moderate: Secondary | ICD-10-CM

## 2025-02-13 ENCOUNTER — Ambulatory Visit: Payer: Medicare Other
